# Patient Record
Sex: Female | Born: 1960
Health system: Southern US, Community
[De-identification: ages and names within clinical notes are randomized; demographics above are authoritative.]

## PROBLEM LIST (undated history)

## (undated) DIAGNOSIS — F419 Anxiety disorder, unspecified: Secondary | ICD-10-CM

## (undated) DIAGNOSIS — D649 Anemia, unspecified: Secondary | ICD-10-CM

## (undated) DIAGNOSIS — I10 Essential (primary) hypertension: Secondary | ICD-10-CM

## (undated) DIAGNOSIS — J45909 Unspecified asthma, uncomplicated: Secondary | ICD-10-CM

## (undated) HISTORY — PX: HYSTEROSCOPY: SHX211

## (undated) HISTORY — PX: OTHER SURGICAL HISTORY: SHX169

---

## 1997-10-13 ENCOUNTER — Emergency Department (HOSPITAL_COMMUNITY): Admission: EM | Admit: 1997-10-13 | Discharge: 1997-10-13 | Payer: Self-pay | Admitting: *Deleted

## 1998-07-16 ENCOUNTER — Encounter: Payer: Self-pay | Admitting: Emergency Medicine

## 1998-07-16 ENCOUNTER — Emergency Department (HOSPITAL_COMMUNITY): Admission: EM | Admit: 1998-07-16 | Discharge: 1998-07-16 | Payer: Self-pay | Admitting: Emergency Medicine

## 1999-08-15 ENCOUNTER — Ambulatory Visit (HOSPITAL_COMMUNITY): Admission: RE | Admit: 1999-08-15 | Discharge: 1999-08-15 | Payer: Self-pay | Admitting: Obstetrics and Gynecology

## 1999-08-15 ENCOUNTER — Encounter (INDEPENDENT_AMBULATORY_CARE_PROVIDER_SITE_OTHER): Payer: Self-pay

## 2005-07-15 ENCOUNTER — Emergency Department (HOSPITAL_COMMUNITY): Admission: EM | Admit: 2005-07-15 | Discharge: 2005-07-16 | Payer: Self-pay | Admitting: Emergency Medicine

## 2008-03-27 ENCOUNTER — Other Ambulatory Visit: Admission: RE | Admit: 2008-03-27 | Discharge: 2008-03-27 | Payer: Self-pay | Admitting: Family Medicine

## 2009-03-29 ENCOUNTER — Other Ambulatory Visit: Admission: RE | Admit: 2009-03-29 | Discharge: 2009-03-29 | Payer: Self-pay | Admitting: Family Medicine

## 2010-07-19 NOTE — Op Note (Signed)
[facility]  Patient:    Donna Peterson, Donna Peterson                      MRN: [id number] Proc. Date: 08/15/99 Adm. Date:  [id number] Disc. Date: [id number] Attending:  Lendon Colonel                           Operative Report  PREOPERATIVE DIAGNOSIS:       Diffuse periods and uterine fibroids.  POSTOPERATIVE DIAGNOSIS:      Diffuse periods and uterine fibroids.  OPERATION:                    Hysteroscopy with resection of fibroids.  SURGEON:                      Katherine Roan, M.D.  DESCRIPTION OF PROCEDURE:     The patient was placed in the lithotomy position, prepped and draped in the usual fashion.  The osmotic dilator was removed.  The hysteroscope was inserted and two large endometrial submucosal fibroids were resected using the resectoscope.  No unusual blood loss occurred.  Prior to the resection, in injected the uterus with approximately 20 cc of a Pitressin solution.  All of the resected material was sent to the lab for study. DD:  08/15/99 TD:  08/19/99 Job: [id number]

## 2010-11-02 ENCOUNTER — Emergency Department (HOSPITAL_COMMUNITY): Payer: 59

## 2010-11-02 ENCOUNTER — Emergency Department (HOSPITAL_COMMUNITY)
Admission: EM | Admit: 2010-11-02 | Discharge: 2010-11-02 | Disposition: A | Discharge status: Home / Self Care | Payer: 59 | Attending: Emergency Medicine | Admitting: Emergency Medicine

## 2010-11-02 DIAGNOSIS — R509 Fever, unspecified: Secondary | ICD-10-CM | POA: Insufficient documentation

## 2010-11-02 DIAGNOSIS — R6884 Jaw pain: Secondary | ICD-10-CM | POA: Insufficient documentation

## 2010-11-02 DIAGNOSIS — R0602 Shortness of breath: Secondary | ICD-10-CM | POA: Insufficient documentation

## 2010-11-02 DIAGNOSIS — R079 Chest pain, unspecified: Secondary | ICD-10-CM | POA: Insufficient documentation

## 2010-11-02 DIAGNOSIS — M79609 Pain in unspecified limb: Secondary | ICD-10-CM | POA: Insufficient documentation

## 2010-11-02 DIAGNOSIS — J984 Other disorders of lung: Secondary | ICD-10-CM | POA: Insufficient documentation

## 2010-11-02 LAB — POCT I-STAT TROPONIN I: Troponin i, poc: 0 ng/mL (ref 0.00–0.08)

## 2010-11-02 LAB — DIFFERENTIAL
Basophils Absolute: 0 10*3/uL (ref 0.0–0.1)
Basophils Relative: 0 % (ref 0–1)
Eosinophils Absolute: 0.1 10*3/uL (ref 0.0–0.7)
Eosinophils Relative: 2 % (ref 0–5)
Lymphocytes Relative: 36 % (ref 12–46)
Lymphs Abs: 2.5 10*3/uL (ref 0.7–4.0)
Monocytes Absolute: 0.5 10*3/uL (ref 0.1–1.0)
Monocytes Relative: 8 % (ref 3–12)
Neutro Abs: 3.8 10*3/uL (ref 1.7–7.7)
Neutrophils Relative %: 55 % (ref 43–77)

## 2010-11-02 LAB — CBC
HCT: 38.3 % (ref 36.0–46.0)
Hemoglobin: 12.7 g/dL (ref 12.0–15.0)
MCH: 24 pg — ABNORMAL LOW (ref 26.0–34.0)
MCHC: 33.2 g/dL (ref 30.0–36.0)
MCV: 72.3 fL — ABNORMAL LOW (ref 78.0–100.0)
Platelets: 273 10*3/uL (ref 150–400)
RBC: 5.3 MIL/uL — ABNORMAL HIGH (ref 3.87–5.11)
RDW: 14.4 % (ref 11.5–15.5)
WBC: 6.9 10*3/uL (ref 4.0–10.5)

## 2010-11-02 LAB — BASIC METABOLIC PANEL
BUN: 16 mg/dL (ref 6–23)
CO2: 24 mEq/L (ref 19–32)
Calcium: 9.1 mg/dL (ref 8.4–10.5)
Chloride: 105 mEq/L (ref 96–112)
Creatinine, Ser: 1.01 mg/dL (ref 0.50–1.10)
GFR calc Af Amer: >60 mL/min (ref >60)
GFR calc non Af Amer: 59 mL/min — ABNORMAL LOW (ref >60)
Glucose, Bld: 103 mg/dL — ABNORMAL HIGH (ref 70–99)
Potassium: 3.5 mEq/L (ref 3.5–5.1)
Sodium: 139 mEq/L (ref 135–145)

## 2010-11-02 MED ORDER — IOHEXOL 300 MG/ML  SOLN
90.0000 mL | Freq: Once | INTRAMUSCULAR | Status: AC | PRN
  Administered 2010-11-02: 90 mL via INTRAVENOUS

## 2012-01-23 ENCOUNTER — Emergency Department (HOSPITAL_COMMUNITY)
Admission: EM | Admit: 2012-01-23 | Discharge: 2012-01-23 | Disposition: A | Discharge status: Home / Self Care | Payer: 59 | Attending: Emergency Medicine | Admitting: Emergency Medicine

## 2012-01-23 ENCOUNTER — Emergency Department (HOSPITAL_COMMUNITY): Payer: 59

## 2012-01-23 ENCOUNTER — Encounter (HOSPITAL_COMMUNITY): Payer: Self-pay | Admitting: *Deleted

## 2012-01-23 DIAGNOSIS — J069 Acute upper respiratory infection, unspecified: Secondary | ICD-10-CM | POA: Insufficient documentation

## 2012-01-23 DIAGNOSIS — G4700 Insomnia, unspecified: Secondary | ICD-10-CM | POA: Insufficient documentation

## 2012-01-23 DIAGNOSIS — F419 Anxiety disorder, unspecified: Secondary | ICD-10-CM

## 2012-01-23 DIAGNOSIS — I10 Essential (primary) hypertension: Secondary | ICD-10-CM | POA: Insufficient documentation

## 2012-01-23 DIAGNOSIS — J45909 Unspecified asthma, uncomplicated: Secondary | ICD-10-CM | POA: Insufficient documentation

## 2012-01-23 DIAGNOSIS — F430 Acute stress reaction: Secondary | ICD-10-CM | POA: Insufficient documentation

## 2012-01-23 DIAGNOSIS — F411 Generalized anxiety disorder: Secondary | ICD-10-CM | POA: Insufficient documentation

## 2012-01-23 HISTORY — DX: Unspecified asthma, uncomplicated: J45.909

## 2012-01-23 HISTORY — DX: Essential (primary) hypertension: I10

## 2012-01-23 LAB — COMPREHENSIVE METABOLIC PANEL
ALT: 22 U/L (ref 0–35)
AST: 24 U/L (ref 0–37)
Albumin: 3.6 g/dL (ref 3.5–5.2)
CO2: 27 mEq/L (ref 19–32)
Calcium: 9.3 mg/dL (ref 8.4–10.5)
Chloride: 96 mEq/L (ref 96–112)
GFR calc non Af Amer: 62 mL/min — ABNORMAL LOW (ref >90)
Sodium: 133 mEq/L — ABNORMAL LOW (ref 135–145)
Total Bilirubin: 0.3 mg/dL (ref 0.3–1.2)

## 2012-01-23 LAB — CBC
Platelets: 324 10*3/uL (ref 150–400)
RBC: 5.29 MIL/uL — ABNORMAL HIGH (ref 3.87–5.11)
RDW: 13.9 % (ref 11.5–15.5)
WBC: 7.3 10*3/uL (ref 4.0–10.5)

## 2012-01-23 MED ORDER — LORAZEPAM 1 MG PO TABS
1.0000 mg | ORAL_TABLET | Freq: Once | ORAL | Status: AC
  Administered 2012-01-23: 1 mg via ORAL
  Filled 2012-01-23: qty 1

## 2012-01-23 MED ORDER — LORAZEPAM 1 MG PO TABS: 1.0000 mg | ORAL_TABLET | Freq: Three times a day (TID) | ORAL | Status: DC | PRN

## 2012-01-23 NOTE — ED Provider Notes (Signed)
History     CSN: [id number]  Arrival date & time 01/23/12  0226   First MD Initiated Contact with Patient 01/23/12 0235      Chief Complaint  Patient presents with  . Shortness of Breath    (Consider location/radiation/quality/duration/timing/severity/associated sxs/prior treatment) HPI Comments: Pt states has been under a lot of stress related to her mother and father dying around this time of year. + insomnia  No homicidal or suicidal ideation  Patient is a 51 y.o. female presenting with shortness of breath. The history is provided by the patient.  Shortness of Breath  The current episode started today. The problem occurs rarely. The problem has been gradually improving. The problem is mild. Nothing relieves the symptoms. Nothing aggravates the symptoms. Associated symptoms include shortness of breath.    Past Medical History  Diagnosis Date  . Hypertension   . Asthma     not severe    Past Surgical History  Procedure Date  . Cesarian   . Robotic assisted laparoscopic vaginal hysterectomy with fibroid removal     No family history on file.  History  Substance Use Topics  . Smoking status: Never Smoker   . Smokeless tobacco: Not on file  . Alcohol Use: No    OB History    Grav Para Term Preterm Abortions TAB SAB Ect Mult Living                  Review of Systems  Respiratory: Positive for shortness of breath.   All other systems reviewed and are negative.    Allergies  Review of patient's allergies indicates no known allergies.  Home Medications  No current outpatient prescriptions on file.  BP 178/104  Pulse 126  Temp 99.2 F (37.3 C) (Oral)  Resp 18  Ht 5\' 4"  (1.626 m)  Wt 235 lb (106.595 kg)  BMI 40.34 kg/m2  SpO2 98%  Physical Exam  Constitutional: She is oriented to person, place, and time. She appears well-developed and well-nourished.  HENT:  Head: Normocephalic and atraumatic.  Eyes: Conjunctivae normal and EOM are normal. Pupils  are equal, round, and reactive to light.  Neck: Normal range of motion.  Cardiovascular: Normal rate, regular rhythm and normal heart sounds.   Pulmonary/Chest: Effort normal and breath sounds normal.  Abdominal: Soft. Bowel sounds are normal.  Musculoskeletal: Normal range of motion.  Neurological: She is alert and oriented to person, place, and time.  Skin: Skin is warm and dry.  Psychiatric: She has a normal mood and affect. Her behavior is normal.    ED Course  Procedures (including critical care time)   Labs Reviewed  CBC  COMPREHENSIVE METABOLIC PANEL   No results found.   No diagnosis found.    Date: 01/23/2012  Rate: 99  Rhythm: normal sinus rhythm  QRS Axis: normal  Intervals: normal  ST/T Wave abnormalities: normal  Conduction Disutrbances: none  Narrative Interpretation: unremarkable     MDM  Will labs,  Xray,  Ativan,  Reasses.  Mll anxiety.     Improved.  Will dc to outpt fu,  Ret new/worsening sxs     Fae Blossom Lytle Michaels, MD 01/23/12 0408

## 2012-01-23 NOTE — ED Notes (Addendum)
Pt to ED c/o acute onset sob x 1 hour.  Pt states she has been unable to sleep d/t high stress in her life at present.  Denies cardiac hx, but sates "touch" of asthma.  Pt denies swelling, but states increased urination with her bp med.

## 2012-03-12 ENCOUNTER — Other Ambulatory Visit (HOSPITAL_COMMUNITY): Payer: Self-pay | Admitting: Family Medicine

## 2012-03-12 DIAGNOSIS — Z1231 Encounter for screening mammogram for malignant neoplasm of breast: Secondary | ICD-10-CM

## 2012-03-23 ENCOUNTER — Ambulatory Visit (HOSPITAL_COMMUNITY)
Admission: RE | Admit: 2012-03-23 | Discharge: 2012-03-23 | Disposition: A | Discharge status: Home / Self Care | Payer: 59 | Source: Ambulatory Visit | Attending: Family Medicine | Admitting: Family Medicine

## 2012-03-23 DIAGNOSIS — Z1231 Encounter for screening mammogram for malignant neoplasm of breast: Secondary | ICD-10-CM | POA: Insufficient documentation

## 2012-06-16 ENCOUNTER — Other Ambulatory Visit (HOSPITAL_COMMUNITY)
Admission: RE | Admit: 2012-06-16 | Discharge: 2012-06-16 | Disposition: A | Discharge status: Home / Self Care | Payer: 59 | Source: Ambulatory Visit | Attending: Family Medicine | Admitting: Family Medicine

## 2012-06-16 ENCOUNTER — Other Ambulatory Visit: Payer: Self-pay | Admitting: Family Medicine

## 2012-06-16 DIAGNOSIS — Z0000 Encounter for general adult medical examination without abnormal findings: Secondary | ICD-10-CM | POA: Insufficient documentation

## 2012-06-21 ENCOUNTER — Other Ambulatory Visit: Payer: Self-pay | Admitting: Family Medicine

## 2012-06-21 DIAGNOSIS — R911 Solitary pulmonary nodule: Secondary | ICD-10-CM

## 2012-06-30 ENCOUNTER — Inpatient Hospital Stay: Admission: RE | Admit: 2012-06-30 | Payer: 59 | Source: Ambulatory Visit

## 2013-06-10 ENCOUNTER — Other Ambulatory Visit: Payer: Self-pay | Admitting: Gastroenterology

## 2013-10-05 ENCOUNTER — Encounter (HOSPITAL_COMMUNITY): Payer: Self-pay | Admitting: Pharmacy Technician

## 2013-10-12 ENCOUNTER — Other Ambulatory Visit: Payer: Self-pay | Admitting: Gastroenterology

## 2013-10-12 ENCOUNTER — Encounter (HOSPITAL_COMMUNITY): Payer: Self-pay | Admitting: *Deleted

## 2013-10-12 NOTE — Addendum Note (Signed)
Addended byClarene Essex on: 10/12/2013 11:16 AM   Modules accepted: Orders

## 2013-10-21 ENCOUNTER — Encounter (HOSPITAL_COMMUNITY)
Admission: RE | Disposition: A | Discharge status: Home / Self Care | Payer: Self-pay | Source: Ambulatory Visit | Attending: Gastroenterology

## 2013-10-21 ENCOUNTER — Encounter (HOSPITAL_COMMUNITY): Payer: 59 | Admitting: Anesthesiology

## 2013-10-21 ENCOUNTER — Ambulatory Visit (HOSPITAL_COMMUNITY)
Admission: RE | Admit: 2013-10-21 | Discharge: 2013-10-21 | Disposition: A | Discharge status: Home / Self Care | Payer: 59 | Source: Ambulatory Visit | Attending: Gastroenterology | Admitting: Gastroenterology

## 2013-10-21 ENCOUNTER — Encounter (HOSPITAL_COMMUNITY): Payer: Self-pay | Admitting: *Deleted

## 2013-10-21 ENCOUNTER — Ambulatory Visit (HOSPITAL_COMMUNITY): Payer: 59 | Admitting: Anesthesiology

## 2013-10-21 DIAGNOSIS — Z1211 Encounter for screening for malignant neoplasm of colon: Secondary | ICD-10-CM | POA: Diagnosis not present

## 2013-10-21 DIAGNOSIS — Z860100 Personal history of colon polyps, unspecified: Secondary | ICD-10-CM | POA: Insufficient documentation

## 2013-10-21 DIAGNOSIS — D126 Benign neoplasm of colon, unspecified: Secondary | ICD-10-CM | POA: Diagnosis not present

## 2013-10-21 DIAGNOSIS — I10 Essential (primary) hypertension: Secondary | ICD-10-CM | POA: Insufficient documentation

## 2013-10-21 DIAGNOSIS — J45909 Unspecified asthma, uncomplicated: Secondary | ICD-10-CM | POA: Insufficient documentation

## 2013-10-21 HISTORY — DX: Anemia, unspecified: D64.9

## 2013-10-21 HISTORY — PX: COLONOSCOPY WITH PROPOFOL: SHX5780

## 2013-10-21 HISTORY — DX: Anxiety disorder, unspecified: F41.9

## 2013-10-21 SURGERY — COLONOSCOPY WITH PROPOFOL
Anesthesia: Monitor Anesthesia Care

## 2013-10-21 MED ORDER — PROPOFOL 10 MG/ML IV BOLUS
INTRAVENOUS | Status: AC
  Filled 2013-10-21: qty 20

## 2013-10-21 MED ORDER — PROPOFOL 10 MG/ML IV BOLUS
INTRAVENOUS | Status: DC | PRN
  Administered 2013-10-21 (×2): 25 mg via INTRAVENOUS
  Administered 2013-10-21 (×3): 50 mg via INTRAVENOUS
  Administered 2013-10-21 (×2): 25 mg via INTRAVENOUS
  Administered 2013-10-21 (×2): 50 mg via INTRAVENOUS

## 2013-10-21 MED ORDER — LACTATED RINGERS IV SOLN
INTRAVENOUS | Status: DC
  Administered 2013-10-21: 1000 mL via INTRAVENOUS

## 2013-10-21 MED ORDER — SODIUM CHLORIDE 0.9 % IV SOLN: INTRAVENOUS | Status: DC

## 2013-10-21 MED ORDER — LACTATED RINGERS IV SOLN
INTRAVENOUS | Status: DC | PRN
  Administered 2013-10-21 (×2): via INTRAVENOUS

## 2013-10-21 SURGICAL SUPPLY — 22 items

## 2013-10-21 NOTE — Discharge Instructions (Addendum)
Monitored Anesthesia Care Monitored anesthesia care is an anesthesia service for a medical procedure. Anesthesia is the loss of the ability to feel pain. It is produced by medicines called anesthetics. It may affect a small area of your body (local anesthesia), a large area of your body (regional anesthesia), or your entire body (general anesthesia). The need for monitored anesthesia care depends your procedure, your condition, and the potential need for regional or general anesthesia. It is often provided during procedures where:   General anesthesia may be needed if there are complications. This is because you need special care when you are under general anesthesia.   You will be under local or regional anesthesia. This is so that you are able to have higher levels of anesthesia if needed.   You will receive calming medicines (sedatives). This is especially the case if sedatives are given to put you in a semi-conscious state of relaxation (deep sedation). This is because the amount of sedative needed to produce this state can be hard to predict. Too much of a sedative can produce general anesthesia. Monitored anesthesia care is performed by one or more health care providers who have special training in all types of anesthesia. You will need to meet with these health care providers before your procedure. During this meeting, they will ask you about your medical history. They will also give you instructions to follow. (For example, you will need to stop eating and drinking before your procedure. You may also need to stop or change medicines you are taking.) During your procedure, your health care providers will stay with you. They will:   Watch your condition. This includes watching your blood pressure, breathing, and level of pain.   Diagnose and treat problems that occur.   Give medicines if they are needed. These may include calming medicines (sedatives) and anesthetics.   Make sure you are  comfortable.  Having monitored anesthesia care does not necessarily mean that you will be under anesthesia. It does mean that your health care providers will be able to manage anesthesia if you need it or if it occurs. It also means that you will be able to have a different type of anesthesia than you are having if you need it. When your procedure is complete, your health care providers will continue to watch your condition. They will make sure any medicines wear off before you are allowed to go home.  Document Released: 11/13/2004 Document Revised: 07/04/2013 Document Reviewed: 03/31/2012 [facility] Patient Information 2015 [place], [place]. This information is not intended to replace advice given to you by your health care provider. Make sure you discuss any questions you have with your health care provider. Call if question or problem and for biopsy report in one week and followup as needed otherwise probably repeat colonoscopy in one year

## 2013-10-21 NOTE — Op Note (Signed)
[facility] [place], [address]   COLONOSCOPY PROCEDURE REPORT  PATIENT: Donna, Peterson  MR#: [id number] BIRTHDATE: 10/19/1960 , 52  yrs. old GENDER: Female ENDOSCOPIST: Clarene Essex, MD REFERRED HE:NIDPOEU Harris, M.D. PROCEDURE DATE:  10/21/2013 PROCEDURE:   Colonoscopy with snare polypectomy ASA CLASS:   Class II INDICATIONS:Patient's personal history of adenomatous colon polyps difficult to remove MEDICATIONS: propofol (Diprivan) 350mg  IV  DESCRIPTION OF PROCEDURE:   After the risks benefits and alternatives of the procedure were thoroughly explained, informed consent was obtained.  The Pentax Ped Colon [id number]  endoscope was introduced through the anus and advanced to the cecum, which was identified by both the appendix and ileocecal valve , limited by No adverse events experienced. to advance  to the cecum did require abdominal pressure but no position changes.  The quality of the prep was adequate. .  The instrument was then slowly withdrawn as the colon was fully examined.both polyps that were incompletely removed on initial colonoscopy were found in the mid descending a medium size semisolid soft polyp was seen with one small [place] ink tattoo and using the piecemeal technique was removed and pathology placed in the first container we then slowly withdrew to the larger splenic flexure polyp and a tattoo was seen as well and again using the piecemeal technique the polyp was seemingly removed and pathology placed in a second container and on slow withdrawal no additional polyps were seen and  anal rectal pull-through and retroflexion was normal and the scope was inserted sure which of the left side of the colon air and water were suctioned the scope was removed the patient tolerated the procedure well there was no obvious immediate complication         FINDINGS:  1. Large splenic flexure previously partially removed polyp with a tattoo  status post removal in piecemeal fashion with hot snare 2.medium sized mid ascending polyp removed as above3 otherwise within normal limits to the cecum  COMPLICATIONS: none  IMPRESSION:  above  RECOMMENDATIONS: await pathology probably recheck in one year just to make sure complete removal of both polyps and if so we can then extend her interval and happy see back sooner when necessary   _______________________________ eSigned:  Clarene Essex, MD 10/21/2013 9:10 AM   MP:NTIRWER Kenton Kingfisher, MD  PATIENT NAME:  Donna, Peterson MR#: [id number]

## 2013-10-21 NOTE — Progress Notes (Signed)
Donna Peterson 8:07 AM  Subjective: Patient with no complaints and specifically no GI complaints since we last saw her in the office and doing well medically Objective: Vital signs stable afebrile no acute distress exam please see pre-assessment evaluation  Assessment: Difficult to remove colon polyps  Plan: Okay to proceed with colonoscopy to try to remove polyps with anesthesia assistance  [facility] E

## 2013-10-21 NOTE — Anesthesia Preprocedure Evaluation (Addendum)
Anesthesia Evaluation  Patient identified by MRN, date of birth, ID band Patient awake    Reviewed: Allergy & Precautions, H&P , NPO status , Patient's Chart, lab work & pertinent test results  Airway Mallampati: II TM Distance: >3 FB Neck ROM: Full    Dental no notable dental hx.    Pulmonary neg pulmonary ROS, asthma ,  breath sounds clear to auscultation  Pulmonary exam normal       Cardiovascular hypertension, Pt. on medications Rhythm:Regular Rate:Normal     Neuro/Psych negative neurological ROS  negative psych ROS   GI/Hepatic negative GI ROS, Neg liver ROS,   Endo/Other  Morbid obesity  Renal/GU negative Renal ROS  negative genitourinary   Musculoskeletal negative musculoskeletal ROS (+)   Abdominal   Peds negative pediatric ROS (+)  Hematology negative hematology ROS (+)   Anesthesia Other Findings   Reproductive/Obstetrics negative OB ROS                          Anesthesia Physical Anesthesia Plan  ASA: III  Anesthesia Plan: MAC   Post-op Pain Management:    Induction:   Airway Management Planned: Simple Face Mask  Additional Equipment:   Intra-op Plan:   Post-operative Plan:   Informed Consent: I have reviewed the patients History and Physical, chart, labs and discussed the procedure including the risks, benefits and alternatives for the proposed anesthesia with the patient or authorized representative who has indicated his/her understanding and acceptance.   Dental advisory given  Plan Discussed with:   Anesthesia Plan Comments:         Anesthesia Quick Evaluation

## 2013-10-21 NOTE — Anesthesia Postprocedure Evaluation (Signed)
  Anesthesia Post-op Note  Patient: Donna Peterson  Procedure(s) Performed: Procedure(s) (LRB): COLONOSCOPY WITH PROPOFOL (N/A)  Patient Location: PACU  Anesthesia Type: MAC  Level of Consciousness: awake and alert   Airway and Oxygen Therapy: Patient Spontanous Breathing  Post-op Pain: mild  Post-op Assessment: Post-op Vital signs reviewed, Patient's Cardiovascular Status Stable, Respiratory Function Stable, Patent Airway and No signs of Nausea or vomiting  Last Vitals:  Filed Vitals:   10/21/13 0920  BP: 162/98  Pulse: 71  Temp:   Resp: 17    Post-op Vital Signs: stable   Complications: No apparent anesthesia complications

## 2013-10-21 NOTE — Transfer of Care (Signed)
Immediate Anesthesia Transfer of Care Note  Patient: Donna Peterson  Procedure(s) Performed: Procedure(s): COLONOSCOPY WITH PROPOFOL (N/A) HOT HEMOSTASIS (ARGON PLASMA COAGULATION/BICAP) (N/A)  Patient Location: PACU  Anesthesia Type:MAC  Level of Consciousness: awake, sedated and patient cooperative  Airway & Oxygen Therapy: Patient Spontanous Breathing and Patient connected to face mask oxygen  Post-op Assessment: Report given to PACU RN and Post -op Vital signs reviewed and stable  Post vital signs: Reviewed and stable  Complications: No apparent anesthesia complications

## 2013-10-24 ENCOUNTER — Encounter (HOSPITAL_COMMUNITY): Payer: Self-pay | Admitting: Gastroenterology

## 2014-10-02 ENCOUNTER — Other Ambulatory Visit: Payer: Self-pay | Admitting: Family Medicine

## 2015-04-25 ENCOUNTER — Encounter (HOSPITAL_COMMUNITY): Payer: Self-pay | Admitting: *Deleted

## 2015-05-04 ENCOUNTER — Ambulatory Visit (HOSPITAL_COMMUNITY): Admission: RE | Admit: 2015-05-04 | Payer: 59 | Source: Ambulatory Visit | Admitting: Gastroenterology

## 2015-05-04 SURGERY — COLONOSCOPY WITH PROPOFOL
Anesthesia: Monitor Anesthesia Care

## 2015-09-14 ENCOUNTER — Other Ambulatory Visit (HOSPITAL_COMMUNITY)
Admission: RE | Admit: 2015-09-14 | Discharge: 2015-09-14 | Disposition: A | Discharge status: Home / Self Care | Payer: 59 | Source: Ambulatory Visit | Attending: Family Medicine | Admitting: Family Medicine

## 2015-09-14 ENCOUNTER — Other Ambulatory Visit: Payer: Self-pay | Admitting: Family Medicine

## 2015-09-14 DIAGNOSIS — Z01419 Encounter for gynecological examination (general) (routine) without abnormal findings: Secondary | ICD-10-CM | POA: Insufficient documentation

## 2015-09-17 LAB — CYTOLOGY - PAP

## 2016-02-12 ENCOUNTER — Other Ambulatory Visit: Payer: Self-pay | Admitting: Gastroenterology

## 2016-02-14 ENCOUNTER — Encounter (HOSPITAL_COMMUNITY): Payer: Self-pay | Admitting: Anesthesiology

## 2016-02-14 NOTE — Anesthesia Preprocedure Evaluation (Addendum)
Anesthesia Evaluation  Patient identified by MRN, date of birth, ID band Patient awake    Reviewed: Allergy & Precautions, NPO status , Patient's Chart, lab work & pertinent test results, reviewed documented beta blocker date and time   Airway Mallampati: III       Dental no notable dental hx. (+) Teeth Intact, Caps, Dental Advisory Given   Pulmonary asthma ,    Pulmonary exam normal breath sounds clear to auscultation       Cardiovascular hypertension, Pt. on medications and Pt. on home beta blockers Normal cardiovascular exam Rhythm:Regular Rate:Normal     Neuro/Psych Anxiety negative neurological ROS     GI/Hepatic Neg liver ROS, Hx/o colon polyps   Endo/Other  Obesity  Renal/GU negative Renal ROS  negative genitourinary   Musculoskeletal negative musculoskeletal ROS (+)   Abdominal (+) + obese,   Peds  Hematology  (+) anemia ,   Anesthesia Other Findings   Reproductive/Obstetrics                           Lab Results  Component Value Date   WBC 7.3 01/23/2012   HGB 12.9 01/23/2012   HCT 37.9 01/23/2012   MCV 71.6 (L) 01/23/2012   PLT 324 01/23/2012    Anesthesia Physical Anesthesia Plan  ASA: II  Anesthesia Plan: MAC   Post-op Pain Management:    Induction: Intravenous  Airway Management Planned: Natural Airway, Nasal Cannula and Simple Face Mask  Additional Equipment:   Intra-op Plan:   Post-operative Plan:   Informed Consent: I have reviewed the patients History and Physical, chart, labs and discussed the procedure including the risks, benefits and alternatives for the proposed anesthesia with the patient or authorized representative who has indicated his/her understanding and acceptance.   Dental advisory given  Plan Discussed with:   Anesthesia Plan Comments:        Anesthesia Quick Evaluation

## 2016-02-15 ENCOUNTER — Ambulatory Visit (HOSPITAL_COMMUNITY): Payer: 59 | Admitting: Anesthesiology

## 2016-02-15 ENCOUNTER — Encounter (HOSPITAL_COMMUNITY): Payer: Self-pay

## 2016-02-15 ENCOUNTER — Encounter (HOSPITAL_COMMUNITY)
Admission: RE | Disposition: A | Discharge status: Home / Self Care | Payer: Self-pay | Source: Ambulatory Visit | Attending: Gastroenterology

## 2016-02-15 ENCOUNTER — Ambulatory Visit (HOSPITAL_COMMUNITY)
Admission: RE | Admit: 2016-02-15 | Discharge: 2016-02-15 | Disposition: A | Discharge status: Home / Self Care | Payer: 59 | Source: Ambulatory Visit | Attending: Gastroenterology | Admitting: Gastroenterology

## 2016-02-15 DIAGNOSIS — D123 Benign neoplasm of transverse colon: Secondary | ICD-10-CM | POA: Diagnosis not present

## 2016-02-15 DIAGNOSIS — Z1211 Encounter for screening for malignant neoplasm of colon: Secondary | ICD-10-CM | POA: Insufficient documentation

## 2016-02-15 DIAGNOSIS — D122 Benign neoplasm of ascending colon: Secondary | ICD-10-CM | POA: Insufficient documentation

## 2016-02-15 DIAGNOSIS — Z86010 Personal history of colonic polyps: Secondary | ICD-10-CM | POA: Insufficient documentation

## 2016-02-15 DIAGNOSIS — K621 Rectal polyp: Secondary | ICD-10-CM | POA: Insufficient documentation

## 2016-02-15 HISTORY — PX: COLONOSCOPY WITH PROPOFOL: SHX5780

## 2016-02-15 SURGERY — COLONOSCOPY WITH PROPOFOL
Anesthesia: Monitor Anesthesia Care

## 2016-02-15 MED ORDER — MIDAZOLAM HCL 2 MG/2ML IJ SOLN
INTRAMUSCULAR | Status: AC
  Filled 2016-02-15: qty 2

## 2016-02-15 MED ORDER — MIDAZOLAM HCL 2 MG/2ML IJ SOLN
INTRAMUSCULAR | Status: DC | PRN
  Administered 2016-02-15: 2 mg via INTRAVENOUS

## 2016-02-15 MED ORDER — PROPOFOL 10 MG/ML IV BOLUS
INTRAVENOUS | Status: AC
  Filled 2016-02-15: qty 40

## 2016-02-15 MED ORDER — PROPOFOL 10 MG/ML IV BOLUS
INTRAVENOUS | Status: AC
  Filled 2016-02-15: qty 20

## 2016-02-15 MED ORDER — LIDOCAINE 2% (20 MG/ML) 5 ML SYRINGE
INTRAMUSCULAR | Status: DC | PRN
  Administered 2016-02-15: 40 mg via INTRAVENOUS

## 2016-02-15 MED ORDER — LIDOCAINE 2% (20 MG/ML) 5 ML SYRINGE
INTRAMUSCULAR | Status: AC
  Filled 2016-02-15: qty 5

## 2016-02-15 MED ORDER — PROPOFOL 10 MG/ML IV BOLUS
INTRAVENOUS | Status: DC | PRN
  Administered 2016-02-15 (×6): 20 mg via INTRAVENOUS
  Administered 2016-02-15 (×2): 40 mg via INTRAVENOUS
  Administered 2016-02-15 (×3): 20 mg via INTRAVENOUS
  Administered 2016-02-15: 40 mg via INTRAVENOUS
  Administered 2016-02-15 (×2): 20 mg via INTRAVENOUS
  Administered 2016-02-15: 10 mg via INTRAVENOUS
  Administered 2016-02-15: 20 mg via INTRAVENOUS

## 2016-02-15 MED ORDER — ONDANSETRON HCL 4 MG/2ML IJ SOLN
INTRAMUSCULAR | Status: AC
  Filled 2016-02-15: qty 2

## 2016-02-15 MED ORDER — SODIUM CHLORIDE 0.9 % IV SOLN: INTRAVENOUS | Status: DC

## 2016-02-15 MED ORDER — LACTATED RINGERS IV SOLN
INTRAVENOUS | Status: DC
  Administered 2016-02-15 (×2): via INTRAVENOUS

## 2016-02-15 MED ORDER — ONDANSETRON HCL 4 MG/2ML IJ SOLN
INTRAMUSCULAR | Status: DC | PRN
  Administered 2016-02-15: 4 mg via INTRAVENOUS

## 2016-02-15 SURGICAL SUPPLY — 22 items

## 2016-02-15 NOTE — Op Note (Signed)
[facility] Patient Name: Donna Peterson Procedure Date: 02/15/2016 MRN: [id number] Attending MD: Clarene Essex , MD Date of Birth: June 04, 1960 CSN: [id number] Age: 55 Admit Type: Outpatient Procedure:                Colonoscopy Indications:              High risk colon cancer surveillance: Personal                            history of large difficult to remove colonic                            polyps, Last colonoscopy: August 2015 Providers:                Clarene Essex, MD, Cleda Daub, RN, Alfonso Patten,                            Technician, Glenis Smoker, CRNA Referring MD:              Medicines:                Midazolam 2 mg IV, Propofol total dose 370 mg IV,40                            mg IV lidocaine Complications:            No immediate complications. Estimated Blood Loss:     Estimated blood loss: none. Procedure:                Pre-Anesthesia Assessment:                           - Prior to the procedure, a History and Physical                            was performed, and patient medications and                            allergies were reviewed. The patient's tolerance of                            previous anesthesia was also reviewed. The risks                            and benefits of the procedure and the sedation                            options and risks were discussed with the patient.                            All questions were answered, and informed consent                            was obtained. Prior Anticoagulants: The patient has  taken aspirin, last dose was 7 days prior to                            procedure. ASA Grade Assessment: II - A patient                            with mild systemic disease. After reviewing the                            risks and benefits, the patient was deemed in                            satisfactory condition to undergo the procedure.                           After obtaining  informed consent, the colonoscope                            was passed under direct vision. Throughout the                            procedure, the patient's blood pressure, pulse, and                            oxygen saturations were monitored continuously. The                            [id number]) scope was introduced through                            the anus and advanced to the the cecum, identified                            by appendiceal orifice and ileocecal valve. The                            ileocecal valve, appendiceal orifice, and rectum                            were photographed. The colonoscopy was performed                            without difficulty. The patient tolerated the                            procedure well. The quality of the bowel                            preparation was adequate. Scope In: 7:43:32 AM Scope Out: 8:15:25 AM Scope Withdrawal Time: 0 hours 27 minutes 0 seconds  Total Procedure Duration: 0 hours 31 minutes 53 seconds  Findings:      A diminutive polyp was found in the rectum. The polyp was semi-sessile.       Biopsies were  taken with a cold forceps for histology.      A small polyp was found in the splenic flexure. The polyp was       semi-sessile. the [place] ink was present The polyp was removed with a       hot snare. Resection and retrieval were complete.      A probable scar from previous polypectomy versus a small polyp was found       in the mid ascending colon. The ?polyp was semi-sessile. Biopsies were       taken with a cold forceps for histology. we did not appreciate the tiny       amount of [place] ink supposedly tattooed in the ascending in the past      The exam was otherwise without abnormality. Impression:               - One diminutive polyp in the rectum. Biopsied.                           - One small polyp at the splenic flexure, removed                            with a hot snare. Resected and retrieved.                            - One scar versus questionable small polyp in the                            mid ascending colon. Biopsied.                           - The examination was otherwise normal. Moderate Sedation:      N/A- Per Anesthesia Care Recommendation:           - Patient has a contact number available for                            emergencies. The signs and symptoms of potential                            delayed complications were discussed with the                            patient. Return to normal activities tomorrow.                            Written discharge instructions were provided to the                            patient.                           - Soft diet today.                           - Continue present medications.                           -  Await pathology results.                           - Repeat colonoscopy in 3 years for surveillance                            based on pathology results.                           - Return to GI office PRN.                           - Telephone GI clinic for pathology results in 1                            week.                           - Telephone GI clinic if symptomatic PRN. Procedure Code(s):        --- Professional ---                           [phone], Colonoscopy, flexible; with removal of                            tumor(s), polyp(s), or other lesion(s) by snare                            technique                           45380, 61, Colonoscopy, flexible; with biopsy,                            single or multiple Diagnosis Code(s):        --- Professional ---                           Z86.010, Personal history of colonic polyps                           K62.1, Rectal polyp                           D12.3, Benign neoplasm of transverse colon (hepatic                            flexure or splenic flexure)                           D12.2, Benign neoplasm of ascending colon CPT copyright 2016 American  Medical Association. All rights reserved. The codes documented in this report are preliminary and upon coder review may  be revised to meet current compliance requirements. Clarene Essex, MD 02/15/2016 8:27:04 AM This report has been signed electronically. Number of Addenda: 0

## 2016-02-15 NOTE — Transfer of Care (Signed)
Immediate Anesthesia Transfer of Care Note  Patient: Donna Peterson  Procedure(s) Performed: Procedure(s): COLONOSCOPY WITH PROPOFOL (N/A) HOT HEMOSTASIS (ARGON PLASMA COAGULATION/BICAP) (N/A)  Patient Location: PACU Endo  Anesthesia Type:MAC  Level of Consciousness: awake and alert   Airway & Oxygen Therapy: Patient Spontanous Breathing and Patient connected to face mask oxygen  Post-op Assessment: Report given to RN and Post -op Vital signs reviewed and stable  Post vital signs: Reviewed and stable  Last Vitals:  Vitals:   02/15/16 0636  BP: 132/83  Pulse: (!) 102  Resp: 19  Temp: 36.9 C    Last Pain:  Vitals:   02/15/16 0636  TempSrc: Oral         Complications: No apparent anesthesia complications

## 2016-02-15 NOTE — Progress Notes (Signed)
Donna Peterson 7:29 AM  Subjective: Patient without any GI complaints and no new medical problems since I last saw her in the office  Objective: Vital signs stable afebrile exam please see preassessment evaluation July labs reviewed  Assessment: Difficult to remove large colon polyps  Plan: Okay to proceed with colonoscopy with anesthesia assistance  [facility] E  Pager [phone] After 5PM or if no answer call [phone]

## 2016-02-15 NOTE — Discharge Instructions (Signed)
Call if question or problem otherwise call for biopsy report in 1 week and probably repeat colonoscopy in our office in 3 years but follow-up sooner if any GI problems arise

## 2016-02-15 NOTE — Anesthesia Postprocedure Evaluation (Signed)
Anesthesia Post Note  Patient: Donna Peterson  Procedure(s) Performed: Procedure(s) (LRB): COLONOSCOPY WITH PROPOFOL (N/A) HOT HEMOSTASIS (ARGON PLASMA COAGULATION/BICAP) (N/A)  Patient location during evaluation: PACU Anesthesia Type: MAC Level of consciousness: awake and alert and oriented Pain management: pain level controlled Vital Signs Assessment: post-procedure vital signs reviewed and stable Respiratory status: spontaneous breathing, nonlabored ventilation and respiratory function stable Cardiovascular status: stable and blood pressure returned to baseline Postop Assessment: no signs of nausea or vomiting Anesthetic complications: no    Last Vitals:  Vitals:   02/15/16 0636 02/15/16 0825  BP: 132/83 (!) 135/101  Pulse: (!) 102 77  Resp: 19 (!) 29  Temp: 36.9 C 36.6 C    Last Pain:  Vitals:   02/15/16 0825  TempSrc: Oral                 Donna Coxe A.

## 2016-02-18 ENCOUNTER — Encounter (HOSPITAL_COMMUNITY): Payer: Self-pay | Admitting: Gastroenterology

## 2016-02-20 ENCOUNTER — Other Ambulatory Visit: Payer: Self-pay | Admitting: Family Medicine

## 2016-02-20 DIAGNOSIS — Z1231 Encounter for screening mammogram for malignant neoplasm of breast: Secondary | ICD-10-CM

## 2016-04-10 ENCOUNTER — Ambulatory Visit
Admission: RE | Admit: 2016-04-10 | Discharge: 2016-04-10 | Disposition: A | Discharge status: Home / Self Care | Payer: 59 | Source: Ambulatory Visit | Attending: Family Medicine | Admitting: Family Medicine

## 2016-04-10 DIAGNOSIS — Z1231 Encounter for screening mammogram for malignant neoplasm of breast: Secondary | ICD-10-CM

## 2016-04-11 ENCOUNTER — Other Ambulatory Visit: Payer: Self-pay | Admitting: Family Medicine

## 2016-04-11 DIAGNOSIS — R928 Other abnormal and inconclusive findings on diagnostic imaging of breast: Secondary | ICD-10-CM

## 2016-04-14 ENCOUNTER — Other Ambulatory Visit: Payer: Self-pay | Admitting: Family Medicine

## 2016-04-16 ENCOUNTER — Ambulatory Visit
Admission: RE | Admit: 2016-04-16 | Discharge: 2016-04-16 | Disposition: A | Discharge status: Home / Self Care | Payer: 59 | Source: Ambulatory Visit | Attending: Family Medicine | Admitting: Family Medicine

## 2016-04-16 DIAGNOSIS — R928 Other abnormal and inconclusive findings on diagnostic imaging of breast: Secondary | ICD-10-CM

## 2016-04-16 DIAGNOSIS — N6310 Unspecified lump in the right breast, unspecified quadrant: Secondary | ICD-10-CM | POA: Diagnosis not present

## 2016-04-16 DIAGNOSIS — N6011 Diffuse cystic mastopathy of right breast: Secondary | ICD-10-CM | POA: Diagnosis not present

## 2016-04-29 DIAGNOSIS — F5101 Primary insomnia: Secondary | ICD-10-CM | POA: Diagnosis not present

## 2016-04-29 DIAGNOSIS — R002 Palpitations: Secondary | ICD-10-CM | POA: Diagnosis not present

## 2016-04-29 DIAGNOSIS — L719 Rosacea, unspecified: Secondary | ICD-10-CM | POA: Diagnosis not present

## 2016-09-30 DIAGNOSIS — I10 Essential (primary) hypertension: Secondary | ICD-10-CM | POA: Diagnosis not present

## 2016-09-30 DIAGNOSIS — Z0000 Encounter for general adult medical examination without abnormal findings: Secondary | ICD-10-CM | POA: Diagnosis not present

## 2016-12-22 ENCOUNTER — Other Ambulatory Visit: Payer: Self-pay | Admitting: Nurse Practitioner

## 2016-12-22 ENCOUNTER — Ambulatory Visit
Admission: RE | Admit: 2016-12-22 | Discharge: 2016-12-22 | Disposition: A | Discharge status: Home / Self Care | Payer: Worker's Compensation | Source: Ambulatory Visit | Attending: Nurse Practitioner | Admitting: Nurse Practitioner

## 2016-12-22 DIAGNOSIS — R52 Pain, unspecified: Secondary | ICD-10-CM

## 2016-12-22 DIAGNOSIS — R609 Edema, unspecified: Secondary | ICD-10-CM

## 2017-03-31 DIAGNOSIS — F5101 Primary insomnia: Secondary | ICD-10-CM | POA: Diagnosis not present

## 2017-03-31 DIAGNOSIS — I10 Essential (primary) hypertension: Secondary | ICD-10-CM | POA: Diagnosis not present

## 2017-03-31 DIAGNOSIS — J0110 Acute frontal sinusitis, unspecified: Secondary | ICD-10-CM | POA: Diagnosis not present

## 2017-05-21 ENCOUNTER — Other Ambulatory Visit: Payer: Self-pay | Admitting: Family Medicine

## 2017-05-21 DIAGNOSIS — N6310 Unspecified lump in the right breast, unspecified quadrant: Secondary | ICD-10-CM

## 2017-10-20 DIAGNOSIS — I10 Essential (primary) hypertension: Secondary | ICD-10-CM | POA: Diagnosis not present

## 2017-10-20 DIAGNOSIS — J0110 Acute frontal sinusitis, unspecified: Secondary | ICD-10-CM | POA: Diagnosis not present

## 2017-10-20 DIAGNOSIS — Z0000 Encounter for general adult medical examination without abnormal findings: Secondary | ICD-10-CM | POA: Diagnosis not present

## 2017-10-20 DIAGNOSIS — F5101 Primary insomnia: Secondary | ICD-10-CM | POA: Diagnosis not present

## 2018-04-05 ENCOUNTER — Ambulatory Visit
Admission: RE | Admit: 2018-04-05 | Discharge: 2018-04-05 | Disposition: A | Discharge status: Home / Self Care | Payer: 59 | Source: Ambulatory Visit | Attending: Family Medicine | Admitting: Family Medicine

## 2018-04-05 DIAGNOSIS — R928 Other abnormal and inconclusive findings on diagnostic imaging of breast: Secondary | ICD-10-CM | POA: Diagnosis not present

## 2018-04-05 DIAGNOSIS — N6001 Solitary cyst of right breast: Secondary | ICD-10-CM | POA: Diagnosis not present

## 2018-04-05 DIAGNOSIS — N6310 Unspecified lump in the right breast, unspecified quadrant: Secondary | ICD-10-CM

## 2018-04-23 DIAGNOSIS — N183 Chronic kidney disease, stage 3 (moderate): Secondary | ICD-10-CM | POA: Diagnosis not present

## 2018-04-23 DIAGNOSIS — M7582 Other shoulder lesions, left shoulder: Secondary | ICD-10-CM | POA: Diagnosis not present

## 2018-04-23 DIAGNOSIS — I10 Essential (primary) hypertension: Secondary | ICD-10-CM | POA: Diagnosis not present

## 2019-12-09 ENCOUNTER — Other Ambulatory Visit: Payer: Self-pay | Admitting: Family Medicine

## 2019-12-09 ENCOUNTER — Other Ambulatory Visit (HOSPITAL_COMMUNITY)
Admission: RE | Admit: 2019-12-09 | Discharge: 2019-12-09 | Disposition: A | Discharge status: Home / Self Care | Payer: 59 | Source: Ambulatory Visit | Attending: Dermatology | Admitting: Dermatology

## 2019-12-09 DIAGNOSIS — Z0000 Encounter for general adult medical examination without abnormal findings: Secondary | ICD-10-CM | POA: Insufficient documentation

## 2019-12-12 ENCOUNTER — Other Ambulatory Visit: Payer: Self-pay | Admitting: Family Medicine

## 2019-12-12 DIAGNOSIS — Z1231 Encounter for screening mammogram for malignant neoplasm of breast: Secondary | ICD-10-CM

## 2019-12-13 LAB — CYTOLOGY - PAP
Adequacy: ABSENT
Diagnosis: NEGATIVE

## 2020-01-18 ENCOUNTER — Ambulatory Visit: Payer: 59

## 2020-03-08 ENCOUNTER — Ambulatory Visit: Payer: 59

## 2020-03-09 ENCOUNTER — Ambulatory Visit: Payer: 59 | Attending: Internal Medicine

## 2020-03-09 DIAGNOSIS — Z23 Encounter for immunization: Secondary | ICD-10-CM

## 2020-03-09 NOTE — Progress Notes (Signed)
   Covid-19 Vaccination Clinic  Name:  Donna Peterson    MRN: [id number] DOB: 03-24-1960  03/09/2020  Donna Peterson was observed post Covid-19 immunization for 15 minutes without incident. She was provided with Vaccine Information Sheet and instruction to access the V-Safe system.   Donna Peterson was instructed to call 911 with any severe reactions post vaccine: Marland Kitchen Difficulty breathing  . Swelling of face and throat  . A fast heartbeat  . A bad rash all over body  . Dizziness and weakness   Immunizations Administered    Name Date Dose VIS Date Route   Pfizer COVID-19 Vaccine 03/09/2020  5:35 PM 0.3 mL 12/21/2019 Intramuscular   Manufacturer: [place]   Lot: [id number]   NDC: [address]-8

## 2020-04-23 ENCOUNTER — Other Ambulatory Visit: Payer: Self-pay

## 2020-04-23 ENCOUNTER — Ambulatory Visit
Admission: RE | Admit: 2020-04-23 | Discharge: 2020-04-23 | Disposition: A | Discharge status: Home / Self Care | Payer: 59 | Source: Ambulatory Visit | Attending: Family Medicine | Admitting: Family Medicine

## 2020-04-23 DIAGNOSIS — Z1231 Encounter for screening mammogram for malignant neoplasm of breast: Secondary | ICD-10-CM

## 2020-12-21 ENCOUNTER — Emergency Department (HOSPITAL_COMMUNITY)
Admission: EM | Admit: 2020-12-21 | Discharge: 2020-12-21 | Disposition: A | Discharge status: Home / Self Care | Payer: 59 | Attending: Emergency Medicine | Admitting: Emergency Medicine

## 2020-12-21 ENCOUNTER — Encounter (HOSPITAL_COMMUNITY): Payer: Self-pay | Admitting: *Deleted

## 2020-12-21 ENCOUNTER — Other Ambulatory Visit: Payer: Self-pay

## 2020-12-21 DIAGNOSIS — I10 Essential (primary) hypertension: Secondary | ICD-10-CM | POA: Insufficient documentation

## 2020-12-21 DIAGNOSIS — Z7982 Long term (current) use of aspirin: Secondary | ICD-10-CM | POA: Diagnosis not present

## 2020-12-21 DIAGNOSIS — M79604 Pain in right leg: Secondary | ICD-10-CM | POA: Diagnosis present

## 2020-12-21 DIAGNOSIS — J45909 Unspecified asthma, uncomplicated: Secondary | ICD-10-CM | POA: Diagnosis not present

## 2020-12-21 DIAGNOSIS — T148XXA Other injury of unspecified body region, initial encounter: Secondary | ICD-10-CM

## 2020-12-21 MED ORDER — NAPROXEN 500 MG PO TABS: 500.0000 mg | ORAL_TABLET | Freq: Two times a day (BID) | ORAL | 0 refills | Status: DC

## 2020-12-21 MED ORDER — OXYCODONE-ACETAMINOPHEN 5-325 MG PO TABS
1.0000 | ORAL_TABLET | Freq: Once | ORAL | Status: AC
  Administered 2020-12-21: 1 via ORAL
  Filled 2020-12-21: qty 1

## 2020-12-21 NOTE — ED Provider Notes (Signed)
[facility] EMERGENCY DEPARTMENT Provider Note   CSN: [id number] Arrival date & time: 12/21/20  0325     History Chief Complaint  Patient presents with   Leg Pain    Donna Peterson is a 60 y.o. female.  60 year old female presents today for evaluation of right lower extremity pain of 2-day duration.  Patient reports she is fairly active and on Wednesday was working out at [employer] with significant amount of activity that she ended with 100 jumping jacks.  She reports during her jumping jacks she noticed some pain in her right leg which became more pronounced as she was walking out of the gym.  She reports her pain is worse with movements and ambulating.  She reports localized swelling to the outside of her right leg.  She denies fever, dyspnea, inability to bear weight.  She denies recent inactivity, long road trip, or flight.  She does not have history of bleeding disorders or clotting disorders.  She does not have history of cancer.  She has taken ibuprofen over-the-counter with minimal relief.  The history is provided by the patient. No language interpreter was used.      Past Medical History:  Diagnosis Date   Anemia    none recent   Anxiety    Asthma    mild, no inhaler use   Hypertension     There are no problems to display for this patient.   Past Surgical History:  Procedure Laterality Date   cesarian     x 1   COLONOSCOPY WITH PROPOFOL N/A 10/21/2013   Procedure: COLONOSCOPY WITH PROPOFOL;  Surgeon: Jeryl Columbia, MD;  Location: [facility];  Service: Endoscopy;  Laterality: N/A;   COLONOSCOPY WITH PROPOFOL N/A 02/15/2016   Procedure: COLONOSCOPY WITH PROPOFOL;  Surgeon: Clarene Essex, MD;  Location: [facility];  Service: Endoscopy;  Laterality: N/A;   HYSTEROSCOPY     for fibroids     OB History   No obstetric history on file.     No family history on file.  Social History   Tobacco Use   Smoking status: Never   Smokeless tobacco: Never   Substance Use Topics   Alcohol use: No   Drug use: No    Home Medications Prior to Admission medications   Medication Sig Start Date End Date Taking? Authorizing Provider  naproxen (NAPROSYN) 500 MG tablet Take 1 tablet (500 mg total) by mouth 2 (two) times daily. 12/21/20  Yes Deatra Canter, Abimael Zeiter, PA-C  aspirin 325 MG tablet Take 650 mg by mouth every 4 (four) hours as needed for moderate pain.    [provider]  Carboxymethylcellul-Glycerin (LUBRICATING EYE DROPS OP) Apply 1 drop to eye daily as needed (dry eyes).    [provider]  cetirizine (ZYRTEC) 10 MG tablet Take 10 mg by mouth daily as needed for allergies.     [provider]  enalapril-hydrochlorothiazide (VASERETIC) 10-25 MG per tablet Take 2 tablets by mouth every morning.     [provider]  fluticasone (FLONASE) 50 MCG/ACT nasal spray Place 1 spray into both nostrils daily as needed for allergies or rhinitis.     [provider]  LORazepam (ATIVAN) 1 MG tablet Take 1 tablet (1 mg total) by mouth 3 (three) times daily as needed for anxiety. 01/23/12   Helane Rima, MD  nebivolol (BYSTOLIC) 5 MG tablet Take 5 mg by mouth daily as needed (high blood pressure.).     [provider]  norethindrone (AYGESTIN) 5 MG tablet Take 5 mg by mouth every morning.     [provider]  ranitidine (ZANTAC) 150 MG tablet Take 150 mg by mouth daily as needed for heartburn.    [provider]  zolpidem (AMBIEN) 10 MG tablet Take 10 mg by mouth at bedtime as needed for sleep.    [provider]    Allergies    Patient has no known allergies.  Review of Systems   Review of Systems  Constitutional:  Negative for activity change, chills and fever.  Respiratory:  Negative for shortness of breath.   Cardiovascular:  Positive for leg swelling. Negative for chest pain.  Gastrointestinal:  Negative for nausea.  Musculoskeletal:  Positive for gait problem (antalgic  gait). Negative for arthralgias and back pain.  Neurological:  Negative for weakness.  All other systems reviewed and are negative.  Physical Exam Updated Vital Signs BP (!) 148/78   Pulse 80   Temp 98 F (36.7 C) (Oral)   Resp 15   SpO2 99%   Physical Exam Vitals and nursing note reviewed.  Constitutional:      General: She is not in acute distress.    Appearance: Normal appearance. She is not ill-appearing.  HENT:     Head: Normocephalic and atraumatic.     Nose: Nose normal.  Eyes:     Conjunctiva/sclera: Conjunctivae normal.  Cardiovascular:     Rate and Rhythm: Normal rate and regular rhythm.  Pulmonary:     Effort: Pulmonary effort is normal. No respiratory distress.     Breath sounds: Normal breath sounds. No wheezing or rales.  Abdominal:     General: There is no distension.     Tenderness: There is no abdominal tenderness. There is no guarding.  Musculoskeletal:        General: No deformity.     Right lower leg: Edema (localized to distal lateral RLE. without diffuse or pitting edema.) present.     Comments: Patient with visual swelling to right mid lateral fibula. Without diffuse swelling.  Tenderness to palpation present over the localized area of swelling.  Calf without tenderness to palpation.  Right lower extremity pain 5/5 bilaterally and symmetric.  Patient ambulated and observed antalgic gait.  Symptoms reproduced with dorsiflexion and eversion.  Neurovascular intact.  Skin:    Findings: No rash.  Neurological:     Mental Status: She is alert.    ED Results / Procedures / Treatments   Labs (all labs ordered are listed, but only abnormal results are displayed) Labs Reviewed - No data to display  EKG None  Radiology No results found.  Procedures Procedures   Medications Ordered in ED Medications  oxyCODONE-acetaminophen (PERCOCET/ROXICET) 5-325 MG per tablet 1 tablet (1 tablet Oral Given 12/21/20 0801)    ED Course  I have reviewed the  triage vital signs and the nursing notes.  Pertinent labs & imaging results that were available during my care of the patient were reviewed by me and considered in my medical decision making (see chart for details).    MDM Rules/Calculators/A&P                           60 year old female presents today following injury to right lower extremity following her workout.  Patient has localized swelling to her mid fibular area.  History without risk factors for DVT.  Without prior bleeding or clotting history.  She most likely suffered a  MSK injury.  Naproxen prescribed.  Discussed importance of RICE.  Podiatry follow-up given the patient.  Discussed importance of follow-up with podiatry and PCP.  Discussed limit weightbearing until podiatry follow-up.  Crutches given.  Patient voices understanding and is in agreement with plan.  Return precautions discussed.  Final Clinical Impression(s) / ED Diagnoses Final diagnoses:  Right leg pain  Muscle strain    Rx / DC Orders ED Discharge Orders          Ordered    naproxen (NAPROSYN) 500 MG tablet  2 times daily        12/21/20 0757             Evlyn Courier, PA-C 12/21/20 8721    Lennice Sites, DO 12/21/20 1017

## 2020-12-21 NOTE — ED Triage Notes (Signed)
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 Reports having some mild pain in R calf during exercises but the pain has persisted since Wednesday. Denies recent travel

## 2020-12-21 NOTE — Discharge Instructions (Addendum)
As discussed you likely have a muscular injury to your right lower leg following your workout in the gym on Wednesday.  Take naproxen as discussed.  Use crutches to keep weight off.  Ice for 15 to 20 minutes every 3 hours.  Call podiatry office to get a follow-up appointment scheduled.  If you develop worsening swelling, pain, difficulty breathing please return to the emergency room and we can evaluate for DVT at that time.

## 2021-01-30 ENCOUNTER — Encounter: Payer: Self-pay | Admitting: Podiatry

## 2021-01-30 ENCOUNTER — Ambulatory Visit: Payer: 59 | Admitting: Podiatry

## 2021-01-30 ENCOUNTER — Other Ambulatory Visit: Payer: Self-pay

## 2021-01-30 DIAGNOSIS — G5791 Unspecified mononeuropathy of right lower limb: Secondary | ICD-10-CM

## 2021-01-30 MED ORDER — CYCLOBENZAPRINE HCL 10 MG PO TABS: 10.0000 mg | ORAL_TABLET | Freq: Three times a day (TID) | ORAL | 0 refills | Status: AC

## 2021-01-30 MED ORDER — GABAPENTIN 100 MG PO CAPS: 100.0000 mg | ORAL_CAPSULE | Freq: Three times a day (TID) | ORAL | 0 refills | Status: AC | PRN

## 2021-01-30 NOTE — Progress Notes (Signed)
   HPI: 59 y.o. female presenting today as a new patient for evaluation of right leg and foot pain.  Patient states that on 12/18/2020 she was exercising and doing standing stationary sprints and jumping jacks when she sustained some pain and tenderness to the right leg and foot.  She went to the emergency department as well as [facility] physicians urgent care with no improvement.  She was given some anti-inflammatory naproxen and recommended ice.  She presents for further treatment and evaluation  Past Medical History:  Diagnosis Date   Anemia    none recent   Anxiety    Asthma    mild, no inhaler use   Hypertension      Physical Exam: General: The patient is alert and oriented x3 in no acute distress.  Dermatology: Skin is warm, dry and supple bilateral lower extremities. Negative for open lesions or macerations.   Vascular: Palpable pedal pulses bilaterally. No edema or erythema noted. Capillary refill within normal limits.  Neurological: Epicritic and protective threshold grossly intact bilaterally.  Paresthesia with pins-and-needles and burning noted to the lateral aspect of the right leg as it inserts onto the right foot as well  Musculoskeletal Exam: Range of motion within normal limits to all pedal and ankle joints bilateral. Muscle strength 5/5 in all groups bilateral.  There is some tenderness to palpation along the lateral aspect of the leg and foot  Radiographic exam taken at [place] [facility] 12/22/2020: Unremarkable radiographs of the right lower leg.  No radiographic explanation for symptoms.   Assessment: 1.  Neuritis right leg and foot secondary to exercising.  DOI: 12/18/2020 2.  Intermittent muscle cramping right leg and foot   Plan of Care:  1. Patient evaluated.  2.  Prescription for Flexeril 10 mg 3 times daily as needed muscle cramping 3.  Prescription for gabapentin 100 mg 3 times daily 4.  I explained that I do believe the patient is suffering  from irritation of a nerve.  It follows the lateral compartment of the leg into the foot.  Patient describes the pain as pins-and-needles and burning sensation.  Do believe a combination of Flexeril and gabapentin will help suppress the symptoms and reduce the inflammation of the nerve 5.  Return to clinic in 3 weeks  *Exercises with a personal trainer      Edrick Kins, DPM [facility]  Dr. Edrick Kins, DPM    [address], [place] [address]                Office [phone]  Fax [phone]

## 2021-02-20 ENCOUNTER — Ambulatory Visit: Payer: 59 | Admitting: Podiatry

## 2021-02-20 ENCOUNTER — Other Ambulatory Visit: Payer: Self-pay

## 2021-02-20 DIAGNOSIS — G5791 Unspecified mononeuropathy of right lower limb: Secondary | ICD-10-CM

## 2021-02-20 NOTE — Progress Notes (Signed)
° °  HPI: 60 y.o. female presenting today for follow-up evaluation of neuritis to the right leg and foot.  Patient states that on 12/18/2020 she was exercising and doing standing stationary sprints and jumping jacks when she experienced some instant pain and tenderness to the right leg and foot.  She was evaluated by me on 01/30/2021 at which time Flexeril and gabapentin was prescribed short-term.  Patient states that she is significantly better.  Today she is wearing good supportive shoes and sneakers  Past Medical History:  Diagnosis Date   Anemia    none recent   Anxiety    Asthma    mild, no inhaler use   Hypertension     Past Surgical History:  Procedure Laterality Date   cesarian     x 1   COLONOSCOPY WITH PROPOFOL N/A 10/21/2013   Procedure: COLONOSCOPY WITH PROPOFOL;  Surgeon: Jeryl Columbia, MD;  Location: [facility];  Service: Endoscopy;  Laterality: N/A;   COLONOSCOPY WITH PROPOFOL N/A 02/15/2016   Procedure: COLONOSCOPY WITH PROPOFOL;  Surgeon: Clarene Essex, MD;  Location: [facility];  Service: Endoscopy;  Laterality: N/A;   HYSTEROSCOPY     for fibroids    No Known Allergies   Physical Exam: General: The patient is alert and oriented x3 in no acute distress.  Dermatology: Skin is warm, dry and supple bilateral lower extremities. Negative for open lesions or macerations.  Vascular: Palpable pedal pulses bilaterally. Capillary refill within normal limits.  Negative for any significant edema or erythema  Neurological: Light touch and protective threshold grossly intact.  The paresthesia with light touch along the lateral aspect of the leg and foot has improved significantly.  Musculoskeletal Exam: No pedal deformities noted.  Muscle strength 5/5 all compartments of the foot and leg  Assessment: 1.  Neuritis right leg and foot secondary to exercise.  DOI: 12/18/2020 2.  Intermittent muscle cramping right leg and foot   Plan of Care:  1. Patient evaluated.  2.  The  patient states that she is significantly improved.  She may now transition back into activity.  Slow transition to activity.  Avoid explosive movements or exercises for an additional month 3.  Continue Flexeril 10 mg and gabapentin 100 mg nightly. 4.  Patient may be full activity no restrictions beginning 4 weeks from today 5.  Return to clinic as needed      Edrick Kins, DPM [facility]  Dr. Edrick Kins, DPM    [address], [place] [address]                Office [phone]  Fax [phone]

## 2021-03-20 ENCOUNTER — Other Ambulatory Visit: Payer: Self-pay

## 2021-03-20 ENCOUNTER — Ambulatory Visit: Payer: 59 | Admitting: Podiatry

## 2021-03-20 DIAGNOSIS — G5791 Unspecified mononeuropathy of right lower limb: Secondary | ICD-10-CM | POA: Diagnosis not present

## 2021-03-29 NOTE — Progress Notes (Signed)
° °  HPI: 61 y.o. female presenting today for follow-up evaluation of neuritis to the right leg and foot.  Patient states that on 12/18/2020 she was exercising and doing standing stationary sprints and jumping jacks when she experienced some instant pain and tenderness to the right leg and foot.  Patient states that she still feels some slightly numbness and tingling on top of her toes.  She says the gabapentin has helped.  Overall she is doing significantly better.  She has full strength and normal range of motion.  Past Medical History:  Diagnosis Date   Anemia    none recent   Anxiety    Asthma    mild, no inhaler use   Hypertension     Past Surgical History:  Procedure Laterality Date   cesarian     x 1   COLONOSCOPY WITH PROPOFOL N/A 10/21/2013   Procedure: COLONOSCOPY WITH PROPOFOL;  Surgeon: Jeryl Columbia, MD;  Location: [facility];  Service: Endoscopy;  Laterality: N/A;   COLONOSCOPY WITH PROPOFOL N/A 02/15/2016   Procedure: COLONOSCOPY WITH PROPOFOL;  Surgeon: Clarene Essex, MD;  Location: [facility];  Service: Endoscopy;  Laterality: N/A;   HYSTEROSCOPY     for fibroids    No Known Allergies   Physical Exam: General: The patient is alert and oriented x3 in no acute distress.  Dermatology: Skin is warm, dry and supple bilateral lower extremities. Negative for open lesions or macerations.  Vascular: Palpable pedal pulses bilaterally. Capillary refill within normal limits.  Negative for any significant edema or erythema  Neurological: Light touch and protective threshold grossly intact.  The paresthesia with light touch along the lateral aspect of the leg and foot has improved significantly however she continues to feel some slight numbness and tingling.  Musculoskeletal Exam: No pedal deformities noted.  Muscle strength 5/5 all compartments of the foot and leg  Assessment: 1.  Neuritis right leg and foot secondary to exercise.  DOI: 12/18/2020 2.  Intermittent muscle  cramping right leg and foot   Plan of Care:  1. Patient evaluated.  2.  Patient has concerns today and questions regarding returning to the gym.  I recommend that the patient may return to the gym and slowly increase activity over the next 4 weeks 3.  After 4 weeks the patient should be able to return to full activity no restrictions 4.  Return to clinic as needed      Edrick Kins, DPM [facility]  Dr. Edrick Kins, DPM    [address], [place] [address]                Office [phone]  Fax [phone]

## 2021-10-07 IMAGING — MG MM DIGITAL SCREENING BILAT W/ TOMO AND CAD
8 of 15 series · 8 of 40 positions shown · non-contrast
Comparison: Previous exam(s).

CLINICAL DATA: Screening.

EXAM:
DIGITAL SCREENING BILATERAL MAMMOGRAM WITH TOMOSYNTHESIS AND CAD
TECHNIQUE: Bilateral screening digital craniocaudal and mediolateral oblique
mammograms were obtained. Bilateral screening digital breast
tomosynthesis was performed. The images were evaluated with
computer-aided detection.

[R CV synth-2D]
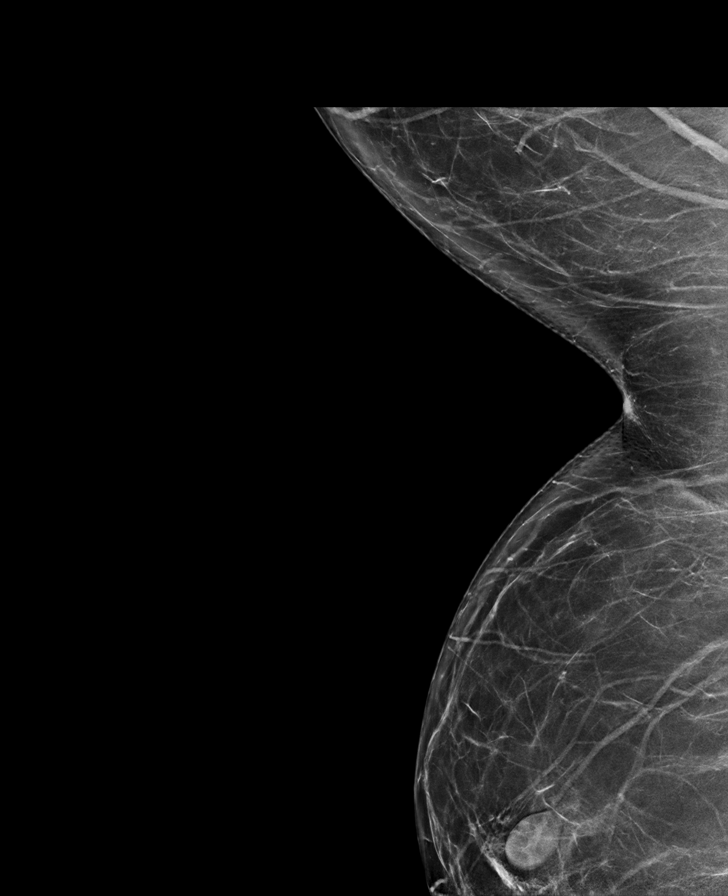

[L CC synth-2D (1 of 2)]
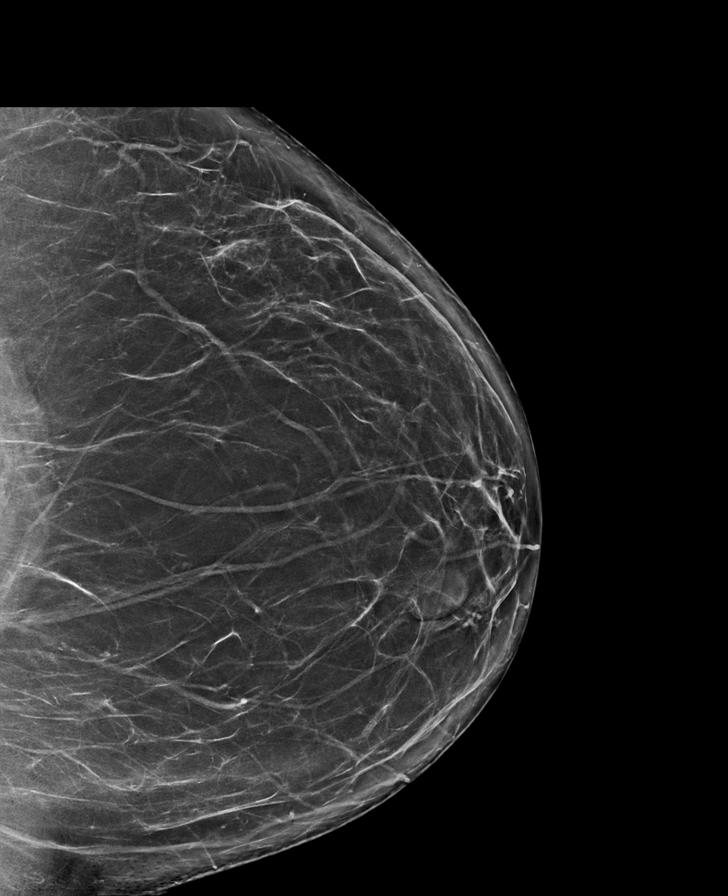

[R CC synth-2D]
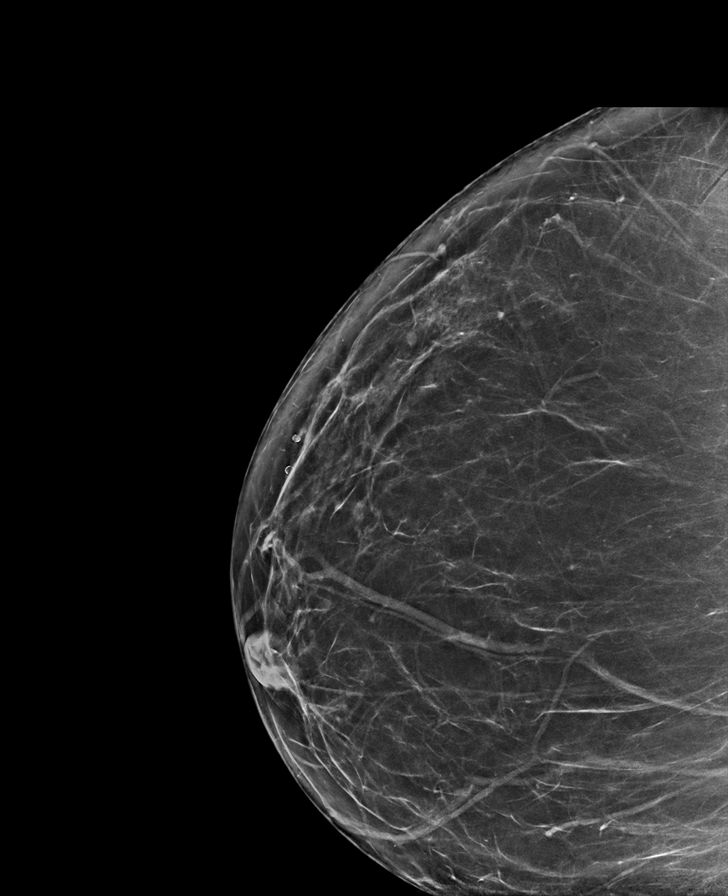

[L MLO synth-2D (1 of 2)]
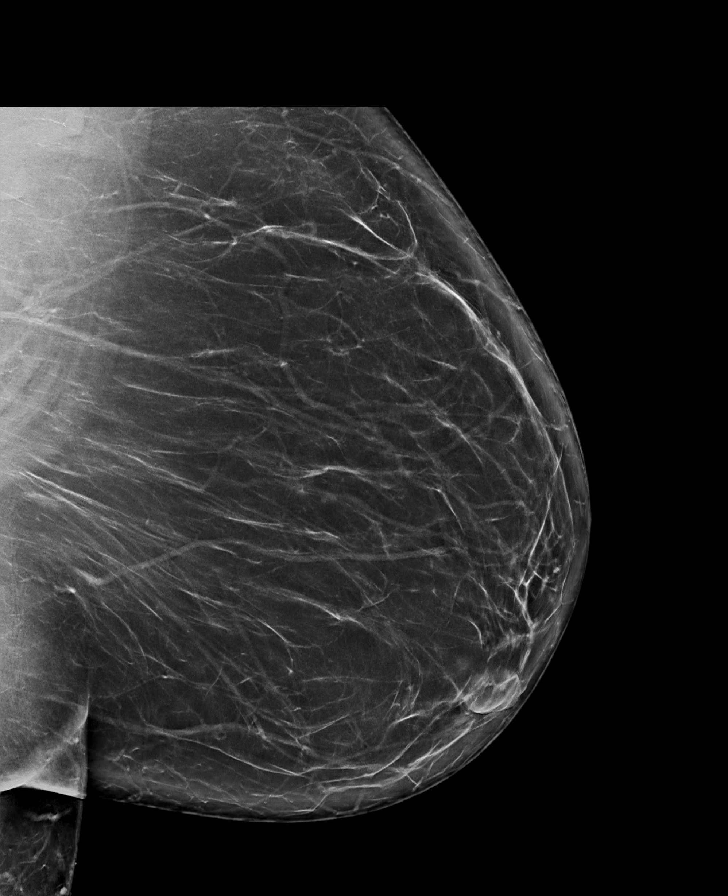

[L MLO synth-2D (2 of 2)]
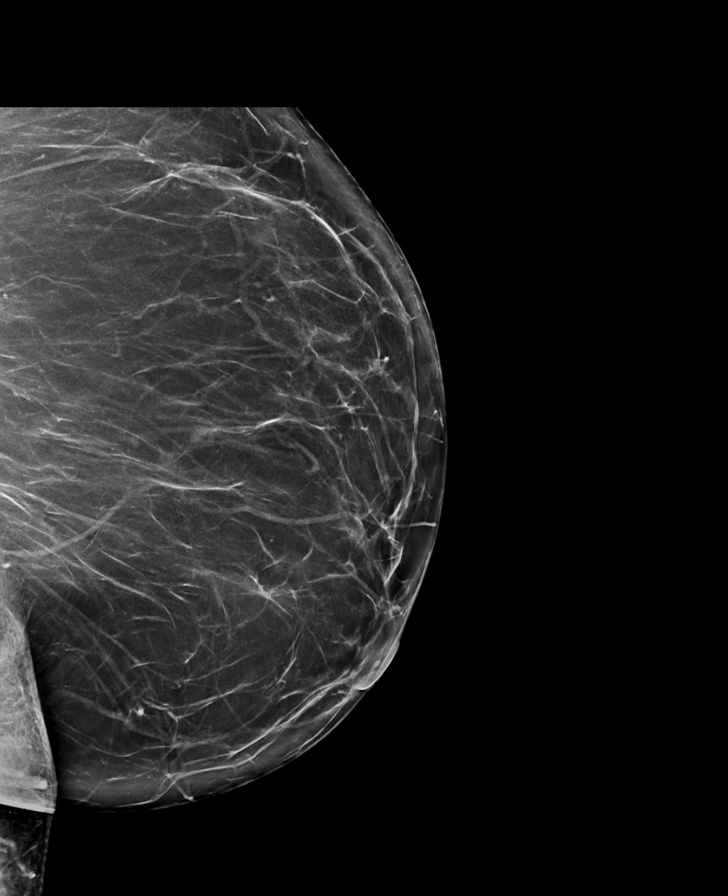

[R MLO synth-2D]
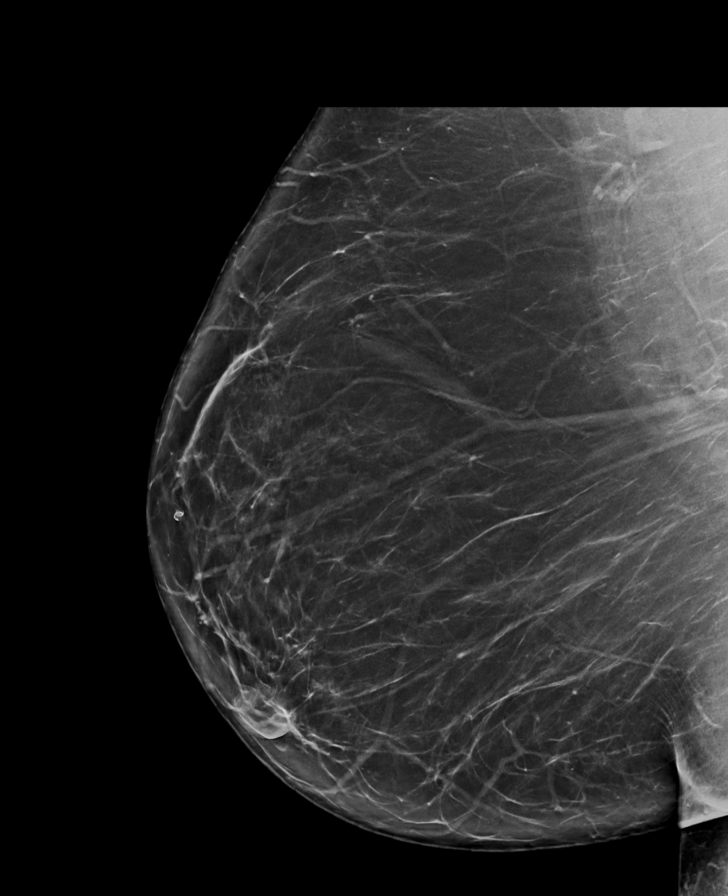

[L CC synth-2D (2 of 2)]
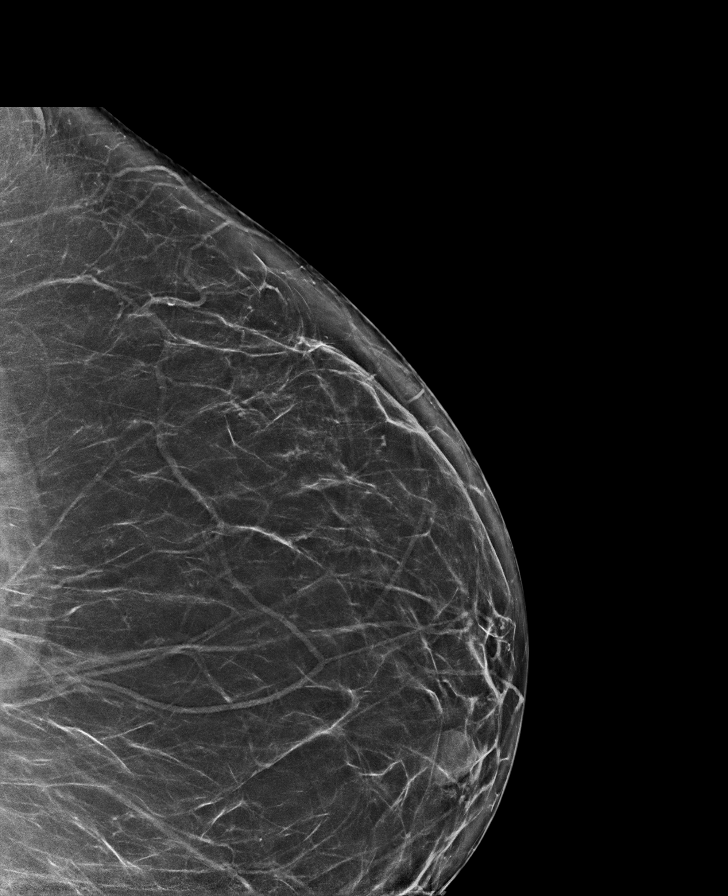

[R MLO tomo · tomo slice 67/98.0]
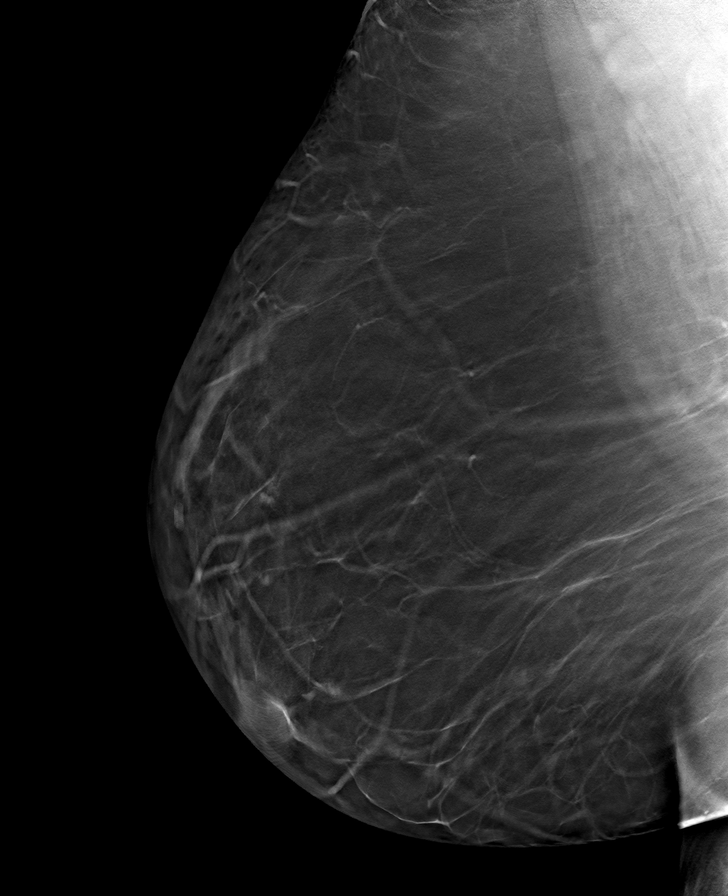

[8 of 40 positions shown; findings below may reference images not displayed]

ACR Breast Density Category b: There are scattered areas of
fibroglandular density.
FINDINGS: There are no findings suspicious for malignancy.
IMPRESSION: No mammographic evidence of malignancy. A result letter of this
screening mammogram will be mailed directly to the patient.

RECOMMENDATION:
Screening mammogram in one year. (Code:51-O-LD2)

BI-RADS CATEGORY  1: Negative.

## 2021-12-18 ENCOUNTER — Other Ambulatory Visit: Payer: Self-pay | Admitting: Family Medicine

## 2021-12-18 DIAGNOSIS — Z1231 Encounter for screening mammogram for malignant neoplasm of breast: Secondary | ICD-10-CM

## 2022-07-15 ENCOUNTER — Ambulatory Visit
Admission: RE | Admit: 2022-07-15 | Discharge: 2022-07-15 | Disposition: A | Discharge status: Home / Self Care | Payer: 59 | Source: Ambulatory Visit | Attending: Family Medicine | Admitting: Family Medicine

## 2022-07-15 DIAGNOSIS — Z1231 Encounter for screening mammogram for malignant neoplasm of breast: Secondary | ICD-10-CM

## 2022-12-31 ENCOUNTER — Other Ambulatory Visit: Payer: Self-pay | Admitting: Family Medicine

## 2022-12-31 ENCOUNTER — Ambulatory Visit
Admission: RE | Admit: 2022-12-31 | Discharge: 2022-12-31 | Disposition: A | Discharge status: Home / Self Care | Payer: 59 | Source: Ambulatory Visit | Attending: Family Medicine | Admitting: Family Medicine

## 2022-12-31 DIAGNOSIS — R0602 Shortness of breath: Secondary | ICD-10-CM

## 2022-12-31 DIAGNOSIS — M545 Low back pain, unspecified: Secondary | ICD-10-CM

## 2023-05-13 ENCOUNTER — Other Ambulatory Visit: Payer: Self-pay

## 2023-05-13 ENCOUNTER — Encounter (HOSPITAL_BASED_OUTPATIENT_CLINIC_OR_DEPARTMENT_OTHER): Payer: Self-pay | Admitting: Physical Therapy

## 2023-05-13 ENCOUNTER — Ambulatory Visit (HOSPITAL_BASED_OUTPATIENT_CLINIC_OR_DEPARTMENT_OTHER): Payer: 59 | Attending: Sports Medicine | Admitting: Physical Therapy

## 2023-05-13 DIAGNOSIS — M5459 Other low back pain: Secondary | ICD-10-CM | POA: Insufficient documentation

## 2023-05-13 DIAGNOSIS — R293 Abnormal posture: Secondary | ICD-10-CM | POA: Diagnosis present

## 2023-05-13 DIAGNOSIS — M6281 Muscle weakness (generalized): Secondary | ICD-10-CM | POA: Insufficient documentation

## 2023-05-13 NOTE — Therapy (Signed)
 OUTPATIENT PHYSICAL THERAPY THORACOLUMBAR EVALUATION   Patient Name: Donna Peterson MRN: [id number] DOB:1960/05/06, 63 y.o., female Today's Date: 05/13/2023  END OF SESSION:  PT End of Session - 05/13/23 1809     Visit Number 1    Number of Visits 16    Date for PT Re-Evaluation 07/10/23    Authorization Type [employer]    PT Start Time 1532    PT Stop Time 1613    PT Time Calculation (min) 41 min    Activity Tolerance Patient tolerated treatment well    Behavior During Therapy [facility] for tasks assessed/performed             Past Medical History:  Diagnosis Date   Anemia    none recent   Anxiety    Asthma    mild, no inhaler use   Hypertension    Past Surgical History:  Procedure Laterality Date   cesarian     x 1   COLONOSCOPY WITH PROPOFOL N/A 10/21/2013   Procedure: COLONOSCOPY WITH PROPOFOL;  Surgeon: Petra Kuba, MD;  Location: [facility];  Service: Endoscopy;  Laterality: N/A;   COLONOSCOPY WITH PROPOFOL N/A 02/15/2016   Procedure: COLONOSCOPY WITH PROPOFOL;  Surgeon: Vida Rigger, MD;  Location: [facility];  Service: Endoscopy;  Laterality: N/A;   HYSTEROSCOPY     for fibroids   There are no active problems to display for this patient.   PCP: Johny Blamer MD  REFERRING PROVIDER: Hurman Horn, MD   REFERRING DIAG: M54.50 (ICD-10-CM) - Low back pain   Rationale for Evaluation and Treatment: Rehabilitation  THERAPY DIAG:  Other low back pain  Abnormal posture  Muscle weakness (generalized)  ONSET DATE: 2 years  SUBJECTIVE:                                                                                                                                                                                           SUBJECTIVE STATEMENT: LBP x 2 years, covid a few years ago worked at home stopped exercising gained weight.  Really has bothered me since.  Has progressively gotten worse.  Have tried different meds with any improvement.  I can't tolerate the gym to  exercise.  I have started with healty weight management to help lose weight.   PERTINENT HISTORY:  Dr Freida Busman- Patient presents with persistent low back pain, unimproved by previous treatments. Celebrex and baclofen were ineffective, with baclofen causing drowsiness. X-ray shows multiple levels of arthritis. NSAIDs are intolerable due to kidney concerns. PT is scheduled, but pain is limiting participation.   PAIN:  Are you having pain? Yes: NPRS scale: current 0/10, worst  7/10 Pain location: LB with radiation to left mid calf Pain description: ache, radiating  Aggravating factors: standing 5-10 minutes, walking 15 minutes Relieving factors: sitting resting, lying down, leaning on cart, getting OOB or donning left pants  PRECAUTIONS: None  RED FLAGS: None   WEIGHT BEARING RESTRICTIONS: No  FALLS:  Has patient fallen in last 6 months? 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  NEXT MD VISIT: end of April  OBJECTIVE:  Note: Objective measures were completed at Evaluation unless otherwise noted.  DIAGNOSTIC FINDINGS:  DG Lumbar spine IMPRESSION: 1. Mild multilevel lumbar spine DDD, worse at L2-L3 and L3-L4. 2. Aortic Atherosclerosis (ICD10-I70.0).  PATIENT SURVEYS:  Modified Oswestry 11/45=24%   COGNITION: Overall cognitive status: Within functional limits for tasks assessed     SENSATION: WFL  MUSCLE LENGTH: Hamstrings: Full knee ext R/L/tight   POSTURE: rounded shoulders and forward head  PALPATION: No TTP body habitus inhibits  LUMBAR ROM:   AROM eval  Flexion Full P!  Extension 50% limited P!  Right lateral flexion 75% limited P!  Left lateral flexion 75% P!  Right rotation   Left rotation    (Blank rows = not  tested)  LOWER EXTREMITY ROM:     wfl  LOWER EXTREMITY MMT:    MMT Right eval Left eval  Hip flexion 50.4 22.0  Hip extension    Hip abduction 27.6 29.3  Hip adduction    Hip internal rotation    Hip external rotation    Knee flexion    Knee extension 35.8 42.8  Ankle dorsiflexion    Ankle plantarflexion    Ankle inversion    Ankle eversion     (Blank rows = not tested)  LUMBAR SPECIAL TESTS:   Trendelenburg sign neg: Negative slump test neg L  FUNCTIONAL TESTS:  5 times sit to stand: 17.54 hurt but felt good Timed up and go (TUG): 12.07  GAIT: Distance walked: 400 ft Assistive device utilized: None Level of assistance: Complete Independence Comments: normal pattern initially.  As pt tires pt falls into forward flexed posture, minimal arm swing  TREATMENT  Eval Self care                                                                                                                                 PATIENT EDUCATION:  Education details: Discussed eval findings, rehab rationale, aquatic program progression/POC and pools in area. Patient is in agreement  Person educated: Patient Education method: Explanation Education comprehension: verbalized understanding  HOME EXERCISE PROGRAM: TBA  ASSESSMENT:  CLINICAL IMPRESSION: Patient is a 63 y.o. f who was seen today for physical therapy evaluation and treatment for LBP. She presents without ad.  Reports pain mostly lumbar spine  with some radiation into bilat hips with radiation occasionally down mid calf presenting as a sharp pain nothing that lingers.  Testing demonstrates pt with poor posture, forward leaning.  Lumbar ROM limited mostly in side bending.  She does have pain with returning to vertical from full flexion.  She has core and left hip weakness.  Diagnostics state mild lumber DDD.  I do suspect her pain is mostly due to muscle weakness and poor posture which has slowly increased over the past few months. She  will benefit from skilled PT intervention to improve deficits and return her to PLOF.  Will begin in aquatics then transition to land as pain is better managed and strength in core and left hip improves.  Will also focus on posture and balance.  OBJECTIVE IMPAIRMENTS: decreased activity tolerance, decreased balance, decreased endurance, decreased mobility, decreased ROM, decreased strength, obesity, and pain.   ACTIVITY LIMITATIONS: carrying, lifting, bending, squatting, bathing, and locomotion level  PARTICIPATION LIMITATIONS: meal prep, cleaning, laundry, shopping, community activity, and yard work  PERSONAL FACTORS: Fitness and 1-2 comorbidities: see problem list  are also affecting patient's functional outcome.   REHAB POTENTIAL: Good  CLINICAL DECISION MAKING: Evolving/moderate complexity  EVALUATION COMPLEXITY: Moderate   GOALS: Goals reviewed with patient? Yes  SHORT TERM GOALS: Target date: 06/11/23  Pt will tolerate full aquatic sessions consistently without increase in pain and with improving function to demonstrate good toleration and effectiveness of intervention.  Baseline: Goal status: INITIAL  2.  Pt will report a 75% decrease in pain with submersion and exercise while engaged Baseline: TBA Goal status: INITIAL  3.  Increase toleration to treadmill at home to 15 minutes and complete 3-4 times weekly Baseline: <10 min Goal status: INITIAL    LONG TERM GOALS: Target date: 07/10/23  Pt to improve on ODI to </= 10% to demonstrate statistically significant Improvement in function. Baseline: 11/45=24% Goal status: INITIAL  2.  Pt will be indep with final HEP's (land and aquatic as appropriate) for continued management of condition Baseline: attempts to walk on treadmill at home Goal status: INITIAL  3.  Pt will improve on 5 X STS test to <or=  13s  to demonstrate improving functional lower extremity strength, transitional movements, and balance Baseline:  17.54 Goal status: INITIAL  4.  Pt will return to full lumbar ROM without pain. Baseline: see chart Goal status: INITIAL  5.  Pt will improve strength of left hip flex to at least 10lb from contralateral side Baseline:see chart Goal status: INITIAL  6.  Pt will report walking through grocery store in upright position x 30 minutes Baseline:  Goal status: INITIAL  PLAN:  PT FREQUENCY: 2x/week  PT DURATION: 8 weeks  PLANNED INTERVENTIONS: 97164- PT Re-evaluation, 97110-Therapeutic exercises, 97530- Therapeutic activity, 97112- Neuromuscular re-education, 97535- Self Care, 60454- Manual therapy, [phone]- Gait training, [phone]- Orthotic Fit/training, [phone]- Aquatic Therapy, [phone]- Electrical stimulation (unattended), [phone]- Ionotophoresis 4mg /ml Dexamethasone, Patient/Family education, Balance training, Stair training, Taping, Dry Needling, Joint mobilization, DME instructions, Cryotherapy, and Moist heat.  PLAN FOR NEXT SESSION: left hip and core strengthening, posture retraining, balance, endurance, body mechanics instruction,   Rushie Chestnut) Reno Clasby MPT 05/13/23 6:11 PM [facility] [address] [place], [place], [address] Phone: [phone]   Fax:  [phone]  Date of referral: 04/11/23 (Pt cancelled 2 evaluation appts) Referring provider: Hurman Horn, MD  Referring diagnosis? M54.50 (ICD-10-CM) - Low back pain  Treatment diagnosis? (if different than referring diagnosis) M54.50 (ICD-10-CM) -  Low back pain   What was this (referring dx) caused by? Ongoing Issue  Ashby Dawes of Condition: Chronic (continuous duration > 3 months)   Laterality: Both  Current Functional Measure Score: Other ODI 24%  Objective measurements identify impairments when they are compared to normal values, the uninvolved extremity, and prior level of function.  [x]  Yes  []  No  Objective assessment of functional ability: Moderate functional  limitations   Briefly describe symptoms: pain across low back with occasional raditation lle  How did symptoms start: Unknown  Average pain intensity:  Last 24 hours: 3-4/10  Past week: 3-4/10  How often does the pt experience symptoms? Frequently  How much have the symptoms interfered with usual daily activities? Moderately  How has condition changed since care began at this facility? NA - initial visit  In general, how is the patients overall health? Good   BACK PAIN (STarT Back Screening Tool) Has pain spread down the leg(s) at some time in the last 2 weeks? yes Has there been pain in the shoulder or neck at some time in the last 2 weeks? no Has the pt only walked short distances because of back pain? yes Has patient dressed more slowly because of back pain in the past 2 weeks? yes Does patient think it's not safe for a person with this condition to be physically active? no Does patient have worrying thoughts a lot of the time? no Does patient feel back pain is terrible and will never get any better? no Has patient stopped enjoying things they usually enjoy? no

## 2023-06-02 ENCOUNTER — Encounter (HOSPITAL_BASED_OUTPATIENT_CLINIC_OR_DEPARTMENT_OTHER): Payer: Self-pay | Admitting: Physical Therapy

## 2023-06-02 ENCOUNTER — Ambulatory Visit (HOSPITAL_BASED_OUTPATIENT_CLINIC_OR_DEPARTMENT_OTHER): Attending: Sports Medicine | Admitting: Physical Therapy

## 2023-06-02 DIAGNOSIS — M5459 Other low back pain: Secondary | ICD-10-CM | POA: Diagnosis present

## 2023-06-02 DIAGNOSIS — R293 Abnormal posture: Secondary | ICD-10-CM | POA: Insufficient documentation

## 2023-06-02 DIAGNOSIS — M6281 Muscle weakness (generalized): Secondary | ICD-10-CM | POA: Insufficient documentation

## 2023-06-02 NOTE — Therapy (Signed)
 OUTPATIENT PHYSICAL THERAPY THORACOLUMBAR TREATMENT   Patient Name: Donna Peterson MRN: [id number] DOB:November 27, 1960, 63 y.o., female Today's Date: 06/02/2023  END OF SESSION:  PT End of Session - 06/02/23 1710     Visit Number 2    Number of Visits 16    Date for PT Re-Evaluation 07/10/23    Authorization Type U[employer]    PT Start Time 1700    PT Stop Time 1739    PT Time Calculation (min) 39 min    Behavior During Therapy WFL for tasks assessed/performed             Past Medical History:  Diagnosis Date   Anemia    none recent   Anxiety    Asthma    mild, no inhaler use   Hypertension    Past Surgical History:  Procedure Laterality Date   cesarian     x 1   COLONOSCOPY WITH PROPOFOL N/A 10/21/2013   Procedure: COLONOSCOPY WITH PROPOFOL;  Surgeon: Petra Kuba, MD;  Location: [facility];  Service: Endoscopy;  Laterality: N/A;   COLONOSCOPY WITH PROPOFOL N/A 02/15/2016   Procedure: COLONOSCOPY WITH PROPOFOL;  Surgeon: Vida Rigger, MD;  Location: [facility];  Service: Endoscopy;  Laterality: N/A;   HYSTEROSCOPY     for fibroids   There are no active problems to display for this patient.   PCP: Johny Blamer MD  REFERRING PROVIDER: Hurman Horn, MD   REFERRING DIAG: M54.50 (ICD-10-CM) - Low back pain   Rationale for Evaluation and Treatment: Rehabilitation  THERAPY DIAG:  Other low back pain  Abnormal posture  Muscle weakness (generalized)  ONSET DATE: 2 years  SUBJECTIVE:                                                                                                                                                                                           SUBJECTIVE STATEMENT: Pt reports she does not know how to swim and has not done aquatic therapy before.    From initial evaluation:  LBP x 2 years, covid a few years ago worked at home stopped exercising gained weight.  Really has bothered me since.  Has progressively gotten worse.  Have tried different  meds with any improvement.  I can't tolerate the gym to exercise.  I have started with healty weight management to help lose weight.   PERTINENT HISTORY:  Dr Freida Busman- Patient presents with persistent low back pain, unimproved by previous treatments. Celebrex and baclofen were ineffective, with baclofen causing drowsiness. X-ray shows multiple levels of arthritis. NSAIDs are intolerable due to kidney concerns. PT is scheduled, but pain is limiting participation.  PAIN:  Are you having pain? Yes: NPRS scale: 7-8/10 Pain location: LB with radiation to Rt thigh Pain description: ache, radiating  Aggravating factors: standing 5-10 minutes, walking 15 minutes Relieving factors: sitting resting, lying down, leaning on cart, getting OOB or donning left pants  PRECAUTIONS: None  RED FLAGS: None   WEIGHT BEARING RESTRICTIONS: No  FALLS:  Has patient fallen in last 6 months? 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  NEXT MD VISIT: end of April  OBJECTIVE:  Note: Objective measures were completed at Evaluation unless otherwise noted.  DIAGNOSTIC FINDINGS:  DG Lumbar spine IMPRESSION: 1. Mild multilevel lumbar spine DDD, worse at L2-L3 and L3-L4. 2. Aortic Atherosclerosis (ICD10-I70.0).  PATIENT SURVEYS:  Modified Oswestry 11/45=24%   COGNITION: Overall cognitive status: Within functional limits for tasks assessed     SENSATION: WFL  MUSCLE LENGTH: Hamstrings: Full knee ext R/L/tight   POSTURE: rounded shoulders and forward head  PALPATION: No TTP body habitus inhibits  LUMBAR ROM:   AROM eval  Flexion Full P!  Extension 50% limited P!  Right lateral flexion 75% limited P!  Left lateral flexion 75% P!  Right rotation   Left rotation     (Blank rows = not tested)  LOWER EXTREMITY ROM:     wfl  LOWER EXTREMITY MMT:    MMT Right eval Left eval  Hip flexion 50.4 22.0  Hip extension    Hip abduction 27.6 29.3  Hip adduction    Hip internal rotation    Hip external rotation    Knee flexion    Knee extension 35.8 42.8  Ankle dorsiflexion    Ankle plantarflexion    Ankle inversion    Ankle eversion     (Blank rows = not tested)  LUMBAR SPECIAL TESTS:   Trendelenburg sign neg: Negative slump test neg L  FUNCTIONAL TESTS:  5 times sit to stand: 17.54 hurt but felt good Timed up and go (TUG): 12.07  GAIT: Distance walked: 400 ft Assistive device utilized: None Level of assistance: Complete Independence Comments: normal pattern initially.  As pt tires pt falls into forward flexed posture, minimal arm swing  TREATMENT  [facility] Adult PT Treatment:                                                DATE: 06/02/23 Pt seen for aquatic therapy today.  Treatment took place in water 3.5-4.75 ft in depth at the [facility] pool. Temp of water was 91.  Pt entered/exited the pool via stairs independently with bilat rail.  - Intro to aquatic therapy principles - UE on barbell:  walking forward with cues to relax LE and allow knees to bend - UE on barbell: walking forward/ backward next to wall, then walking backward across width of pool - UE on barbell: side stepping x 3 laps  - unsupported next to wall: walking forward/ backwards  - UE on wall: relaxed squats for relief in back x 5 - side stepping with arm addct/ abdct  - UE on wall:  heel raises x 10; hip add/abd x 10 ; hip flexion /extension x 10 - marching forward with  reciprocal row with yellow hand floats x 2 laps  Pt requires the buoyancy and hydrostatic pressure of water for support, and to offload joints by unweighting joint load by at least 50 % in navel deep water and by at least 75-80% in chest to neck deep water.  Viscosity of the water is needed for  resistance of strengthening. Water current perturbations provides challenge to standing balance requiring increased core activation.                                                                                                                              PATIENT EDUCATION:  Education details:intro to aquatic therapy   Person educated: Patient Education method: Explanation Education comprehension: verbalized understanding  HOME EXERCISE PROGRAM: TBA  ASSESSMENT:  CLINICAL IMPRESSION: Pt demonstrates safety and independence in aquatic setting with therapist instructing from deck. Pt initially guarded at beginning of session, and required UE support on floatation. Able to progress towards no UE support with improved confidence.  Pt reported reduction of back pain to 4/10 towards end of session.  Required one small rest break in relaxed squat for relief in back. Rhyen is a good candidate for aquatic therapy and will benefit from the properties of water to progress towards land based goals.  Goals are ongoing.       Initial impression: Patient is a 63 y.o. f who was seen today for physical therapy evaluation and treatment for LBP. She presents without ad.  Reports pain mostly lumbar spine with some radiation into bilat hips with radiation occasionally down mid calf presenting as a sharp pain nothing that lingers.  Testing demonstrates pt with poor posture, forward leaning.  Lumbar ROM limited mostly in side bending.  She does have pain with returning to vertical from full flexion.  She has core and left hip weakness.  Diagnostics state mild lumber DDD.  I do suspect her pain is mostly due to muscle weakness and poor posture which has slowly increased over the past few months. She will benefit from skilled PT intervention to improve deficits and return her to PLOF.  Will begin in aquatics then transition to land as pain is better managed and strength in core and left hip improves.  Will also  focus on posture and balance.  OBJECTIVE IMPAIRMENTS: decreased activity tolerance, decreased balance, decreased endurance, decreased mobility, decreased ROM, decreased strength, obesity, and pain.   ACTIVITY LIMITATIONS: carrying, lifting, bending, squatting, bathing, and locomotion level  PARTICIPATION LIMITATIONS: meal prep, cleaning, laundry, shopping, community activity, and yard work  PERSONAL FACTORS: Fitness and 1-2 comorbidities: see problem list  are also affecting patient's functional outcome.   REHAB POTENTIAL: Good  CLINICAL DECISION MAKING: Evolving/moderate complexity  EVALUATION COMPLEXITY: Moderate   GOALS: Goals reviewed with patient? Yes  SHORT TERM GOALS: Target date: 06/11/23  Pt will tolerate full aquatic sessions consistently without increase in pain and with improving function to demonstrate good toleration and effectiveness of intervention.  Baseline: Goal status: INITIAL  2.  Pt will report a 75% decrease in pain with submersion and exercise while engaged Baseline: TBA Goal status: INITIAL  3.  Increase toleration to treadmill at home to 15 minutes and complete 3-4 times weekly Baseline: <10 min Goal status: INITIAL    LONG TERM GOALS: Target date: 07/10/23  Pt to improve on ODI to </= 10% to demonstrate statistically significant Improvement in function. Baseline: 11/45=24% Goal status: INITIAL  2.  Pt will be indep with final HEP's (land and aquatic as appropriate) for continued management of condition Baseline: attempts to walk on treadmill at home Goal status: INITIAL  3.  Pt will improve on 5 X STS test to <or=  13s  to demonstrate improving functional lower extremity strength, transitional movements, and balance Baseline: 17.54 Goal status: INITIAL  4.  Pt will return to full lumbar ROM without pain. Baseline: see chart Goal status: INITIAL  5.  Pt will improve strength of left hip flex to at least 10lb from contralateral  side Baseline:see chart Goal status: INITIAL  6.  Pt will report walking through grocery store in upright position x 30 minutes Baseline:  Goal status: INITIAL  PLAN:  PT FREQUENCY: 2x/week  PT DURATION: 8 weeks  PLANNED INTERVENTIONS: 97164- PT Re-evaluation, 97110-Therapeutic exercises, 97530- Therapeutic activity, 97112- Neuromuscular re-education, 97535- Self Care, 16109- Manual therapy, [phone]- Gait training, [phone]- Orthotic Fit/training, [phone]- Aquatic Therapy, [phone]- Electrical stimulation (unattended), [phone]- Ionotophoresis 4mg /ml Dexamethasone, Patient/Family education, Balance training, Stair training, Taping, Dry Needling, Joint mobilization, DME instructions, Cryotherapy, and Moist heat.  PLAN FOR NEXT SESSION: core strengthening, posture retraining, balance, endurance, body mechanics instruction,   Mayer Camel, PTA 06/02/23 5:53 PM [facility] [address] [place], [place], [address] Phone: [phone]   Fax:  [phone]   Date of referral: 04/11/23 (Pt cancelled 2 evaluation appts) Referring provider: Hurman Horn, MD  Referring diagnosis? M54.50 (ICD-10-CM) - Low back pain  Treatment diagnosis? (if different than referring diagnosis) M54.50 (ICD-10-CM) - Low back pain   What was this (referring dx) caused by? Ongoing Issue  Ashby Dawes of Condition: Chronic (continuous duration > 3 months)   Laterality: Both  Current Functional Measure Score: Other ODI 24%  Objective measurements identify impairments when they are compared to normal values, the uninvolved extremity, and prior level of function.  [x]  Yes  []  No  Objective assessment of functional ability: Moderate functional limitations   Briefly describe symptoms: pain across low back with occasional raditation lle  How did symptoms start: Unknown  Average pain intensity:  Last 24 hours: 3-4/10  Past week: 3-4/10  How often does the pt  experience symptoms? Frequently  How much have the symptoms interfered with usual daily activities? Moderately  How has condition changed since care began at this facility? NA - initial visit  In general, how is the patients overall health? Good   BACK PAIN (STarT Back Screening Tool) Has pain spread down the leg(s) at some time in the last 2 weeks? yes Has there been pain in the shoulder or neck at some time in the last 2 weeks? no Has the pt only walked short distances because of back pain? yes Has patient dressed more slowly because of back pain in the past 2 weeks? yes Does patient think it's not safe for a person with this condition to be physically active? no Does patient have worrying thoughts a lot of the time? no Does patient feel back pain is  terrible and will never get any better? no Has patient stopped enjoying things they usually enjoy? no

## 2023-06-03 ENCOUNTER — Other Ambulatory Visit: Payer: Self-pay

## 2023-06-03 MED ORDER — WEGOVY 0.5 MG/0.5ML ~~LOC~~ SOAJ
0.5000 mg | SUBCUTANEOUS | 0 refills | Status: AC
  Filled 2023-06-03: qty 2, 28d supply, fill #0

## 2023-06-03 MED ORDER — ERGOCALCIFEROL 1.25 MG (50000 UT) PO CAPS
50000.0000 [IU] | ORAL_CAPSULE | ORAL | 2 refills | Status: AC
  Filled 2023-06-03 – 2023-06-09 (×4): qty 4, 28d supply, fill #0
  Filled 2023-07-03: qty 4, 28d supply, fill #1
  Filled 2023-07-31: qty 4, 28d supply, fill #2

## 2023-06-04 ENCOUNTER — Other Ambulatory Visit: Payer: Self-pay

## 2023-06-09 ENCOUNTER — Ambulatory Visit (HOSPITAL_BASED_OUTPATIENT_CLINIC_OR_DEPARTMENT_OTHER): Admitting: Physical Therapy

## 2023-06-09 ENCOUNTER — Other Ambulatory Visit: Payer: Self-pay

## 2023-06-09 ENCOUNTER — Encounter (HOSPITAL_BASED_OUTPATIENT_CLINIC_OR_DEPARTMENT_OTHER): Payer: Self-pay | Admitting: Physical Therapy

## 2023-06-09 DIAGNOSIS — M5459 Other low back pain: Secondary | ICD-10-CM

## 2023-06-09 DIAGNOSIS — M6281 Muscle weakness (generalized): Secondary | ICD-10-CM

## 2023-06-09 DIAGNOSIS — R293 Abnormal posture: Secondary | ICD-10-CM

## 2023-06-09 NOTE — Therapy (Signed)
 OUTPATIENT PHYSICAL THERAPY THORACOLUMBAR TREATMENT   Patient Name: Donna Peterson MRN: [id number] DOB:05-22-1960, 63 y.o., female Today's Date: 06/09/2023  END OF SESSION:  PT End of Session - 06/09/23 1706     Visit Number 3    Number of Visits 16    Date for PT Re-Evaluation 07/10/23    Authorization Type [employer]    PT Start Time 1700    PT Stop Time 1739    PT Time Calculation (min) 39 min    Activity Tolerance Patient tolerated treatment well    Behavior During Therapy WFL for tasks assessed/performed             Past Medical History:  Diagnosis Date   Anemia    none recent   Anxiety    Asthma    mild, no inhaler use   Hypertension    Past Surgical History:  Procedure Laterality Date   cesarian     x 1   COLONOSCOPY WITH PROPOFOL N/A 10/21/2013   Procedure: COLONOSCOPY WITH PROPOFOL;  Surgeon: Petra Kuba, MD;  Location: [facility];  Service: Endoscopy;  Laterality: N/A;   COLONOSCOPY WITH PROPOFOL N/A 02/15/2016   Procedure: COLONOSCOPY WITH PROPOFOL;  Surgeon: Vida Rigger, MD;  Location: [facility];  Service: Endoscopy;  Laterality: N/A;   HYSTEROSCOPY     for fibroids   There are no active problems to display for this patient.   PCP: Johny Blamer MD  REFERRING PROVIDER: Hurman Horn, MD   REFERRING DIAG: M54.50 (ICD-10-CM) - Low back pain   Rationale for Evaluation and Treatment: Rehabilitation  THERAPY DIAG:  Other low back pain  Abnormal posture  Muscle weakness (generalized)  ONSET DATE: 2 years  SUBJECTIVE:                                                                                                                                                                                           SUBJECTIVE STATEMENT: Pt reports she good after last session and went shopping at [employer].  Was sore that night in her LEs. She reports she has been getting up from chair during day at least 1x/hour.   From initial evaluation:  LBP x 2 years,  covid a few years ago worked at home stopped exercising gained weight.  Really has bothered me since.  Has progressively gotten worse.  Have tried different meds with any improvement.  I can't tolerate the gym to exercise.  I have started with healty weight management to help lose weight.   PERTINENT HISTORY:  Dr Freida Busman- Patient presents with persistent low back pain, unimproved by previous treatments. Celebrex and baclofen  were ineffective, with baclofen causing drowsiness. X-ray shows multiple levels of arthritis. NSAIDs are intolerable due to kidney concerns. PT is scheduled, but pain is limiting participation.   PAIN:  Are you having pain? Yes: NPRS scale: 6/10 Pain location: LB with radiation to Rt thigh Pain description: ache, radiating  Aggravating factors: standing 5-10 minutes, walking 15 minutes Relieving factors: sitting resting, lying down, leaning on cart, getting OOB or donning left pants  PRECAUTIONS: None  RED FLAGS: None   WEIGHT BEARING RESTRICTIONS: No  FALLS:  Has patient fallen in last 6 months? 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  NEXT MD VISIT: end of April  OBJECTIVE:  Note: Objective measures were completed at Evaluation unless otherwise noted.  DIAGNOSTIC FINDINGS:  DG Lumbar spine IMPRESSION: 1. Mild multilevel lumbar spine DDD, worse at L2-L3 and L3-L4. 2. Aortic Atherosclerosis (ICD10-I70.0).  PATIENT SURVEYS:  Modified Oswestry 11/45=24%   COGNITION: Overall cognitive status: Within functional limits for tasks assessed     SENSATION: WFL  MUSCLE LENGTH: Hamstrings: Full knee ext R/L/tight   POSTURE: rounded shoulders and forward head  PALPATION: No TTP body habitus inhibits  LUMBAR ROM:    AROM eval  Flexion Full P!  Extension 50% limited P!  Right lateral flexion 75% limited P!  Left lateral flexion 75% P!  Right rotation   Left rotation    (Blank rows = not tested)  LOWER EXTREMITY ROM:     wfl  LOWER EXTREMITY MMT:    MMT Right eval Left eval  Hip flexion 50.4 22.0  Hip extension    Hip abduction 27.6 29.3  Hip adduction    Hip internal rotation    Hip external rotation    Knee flexion    Knee extension 35.8 42.8  Ankle dorsiflexion    Ankle plantarflexion    Ankle inversion    Ankle eversion     (Blank rows = not tested)  LUMBAR SPECIAL TESTS:   Trendelenburg sign neg: Negative slump test neg L  FUNCTIONAL TESTS:  5 times sit to stand: 17.54 hurt but felt good Timed up and go (TUG): 12.07  GAIT: Distance walked: 400 ft Assistive device utilized: None Level of assistance: Complete Independence Comments: normal pattern initially.  As pt tires pt falls into forward flexed posture, minimal arm swing  TREATMENT  [facility] Adult PT Treatment:                                                DATE: 06/09/23 Pt seen for aquatic therapy today.  Treatment took place in water 3.5-4.75 ft in depth at the [facility] pool. Temp of water was 91.  Pt entered/exited the pool via stairs independently with bilat rail.  - UE on barbell:  walking forward across pool x 4 laps; backward 1 lap - UE on barbell: side stepping x 3 laps  - UE on wall: relaxed squats for relief in back x 8; heel raises x 10; hip add/abd x 10 ; hip flexion /extension x 10 ; single leg clams x 10 each LE  - marching forward with alternating row with rainbow hand floats - side stepping  with arm addct/ abdct  - farmer carry with bilat rainbow hand floats at side, marching backwards forward -> single yellow hand float at side marching 1/2 width forward/ backward  - relaxed squat at wall - TrA set with 1/2 hollow noodle pull down to thighs x 4 (challenge)   Pt requires the buoyancy and  hydrostatic pressure of water for support, and to offload joints by unweighting joint load by at least 50 % in navel deep water and by at least 75-80% in chest to neck deep water.  Viscosity of the water is needed for resistance of strengthening. Water current perturbations provides challenge to standing balance requiring increased core activation.                                                                                                                              PATIENT EDUCATION:  Education details:intro to aquatic therapy   Person educated: Patient Education method: Explanation Education comprehension: verbalized understanding  HOME EXERCISE PROGRAM: TBA  ASSESSMENT:  CLINICAL IMPRESSION: Pt demonstrates good improvement in confidence in aquatic setting; able to walk across width of pool at 4 ft without support.  Pt reported reduction of back pain during session. Able to progress exercises today without loss of balance and minimal difficulty. Faustine remains a good candidate for aquatic therapy and will benefit from the properties of water to progress towards land based goals.  Goals are ongoing.       Initial impression: Patient is a 63 y.o. f who was seen today for physical therapy evaluation and treatment for LBP. She presents without ad.  Reports pain mostly lumbar spine with some radiation into bilat hips with radiation occasionally down mid calf presenting as a sharp pain nothing that lingers.  Testing demonstrates pt with poor posture, forward leaning.  Lumbar ROM limited mostly in side bending.  She does have pain with returning to vertical from full flexion.  She has core and left hip weakness.  Diagnostics state mild lumber DDD.  I do suspect her pain is mostly due to muscle weakness and poor posture which has slowly increased over the past few months. She will benefit from skilled PT intervention to improve deficits and return her to PLOF.  Will begin in aquatics then  transition to land as pain is better managed and strength in core and left hip improves.  Will also focus on posture and balance.  OBJECTIVE IMPAIRMENTS: decreased activity tolerance, decreased balance, decreased endurance, decreased mobility, decreased ROM, decreased strength, obesity, and pain.   ACTIVITY LIMITATIONS: carrying, lifting, bending, squatting, bathing, and locomotion level  PARTICIPATION LIMITATIONS: meal prep, cleaning, laundry, shopping, community activity, and yard work  PERSONAL FACTORS: Fitness and 1-2 comorbidities: see problem list  are also affecting patient's functional outcome.   REHAB POTENTIAL: Good  CLINICAL DECISION MAKING: Evolving/moderate complexity  EVALUATION COMPLEXITY: Moderate   GOALS: Goals reviewed with patient? Yes  SHORT TERM GOALS: Target date: 06/11/23  Pt  will tolerate full aquatic sessions consistently without increase in pain and with improving function to demonstrate good toleration and effectiveness of intervention.  Baseline: Goal status: INITIAL  2.  Pt will report a 75% decrease in pain with submersion and exercise while engaged Baseline: TBA Goal status: INITIAL  3.  Increase toleration to treadmill at home to 15 minutes and complete 3-4 times weekly Baseline: <10 min Goal status: INITIAL    LONG TERM GOALS: Target date: 07/10/23  Pt to improve on ODI to </= 10% to demonstrate statistically significant Improvement in function. Baseline: 11/45=24% Goal status: INITIAL  2.  Pt will be indep with final HEP's (land and aquatic as appropriate) for continued management of condition Baseline: attempts to walk on treadmill at home Goal status: INITIAL  3.  Pt will improve on 5 X STS test to <or=  13s  to demonstrate improving functional lower extremity strength, transitional movements, and balance Baseline: 17.54 Goal status: INITIAL  4.  Pt will return to full lumbar ROM without pain. Baseline: see chart Goal status:  INITIAL  5.  Pt will improve strength of left hip flex to at least 10lb from contralateral side Baseline:see chart Goal status: INITIAL  6.  Pt will report walking through grocery store in upright position x 30 minutes Baseline:  Goal status: INITIAL  PLAN:  PT FREQUENCY: 2x/week  PT DURATION: 8 weeks  PLANNED INTERVENTIONS: 97164- PT Re-evaluation, 97110-Therapeutic exercises, 97530- Therapeutic activity, 97112- Neuromuscular re-education, 97535- Self Care, 16109- Manual therapy, [phone]- Gait training, [phone]- Orthotic Fit/training, [phone]- Aquatic Therapy, [phone]- Electrical stimulation (unattended), [phone]- Ionotophoresis 4mg /ml Dexamethasone, Patient/Family education, Balance training, Stair training, Taping, Dry Needling, Joint mobilization, DME instructions, Cryotherapy, and Moist heat.  PLAN FOR NEXT SESSION: core strengthening, posture retraining, balance, endurance, body mechanics instruction,   Mayer Camel, PTA 06/09/23 5:48 PM [facility] [address] [place], [place], [address] Phone: [phone]   Fax:  [phone]    Date of referral: 04/11/23 (Pt cancelled 2 evaluation appts) Referring provider: Hurman Horn, MD  Referring diagnosis? M54.50 (ICD-10-CM) - Low back pain  Treatment diagnosis? (if different than referring diagnosis) M54.50 (ICD-10-CM) - Low back pain   What was this (referring dx) caused by? Ongoing Issue  Ashby Dawes of Condition: Chronic (continuous duration > 3 months)   Laterality: Both  Current Functional Measure Score: Other ODI 24%  Objective measurements identify impairments when they are compared to normal values, the uninvolved extremity, and prior level of function.  [x]  Yes  []  No  Objective assessment of functional ability: Moderate functional limitations   Briefly describe symptoms: pain across low back with occasional raditation lle  How did symptoms start:  Unknown  Average pain intensity:  Last 24 hours: 3-4/10  Past week: 3-4/10  How often does the pt experience symptoms? Frequently  How much have the symptoms interfered with usual daily activities? Moderately  How has condition changed since care began at this facility? NA - initial visit  In general, how is the patients overall health? Good   BACK PAIN (STarT Back Screening Tool) Has pain spread down the leg(s) at some time in the last 2 weeks? yes Has there been pain in the shoulder or neck at some time in the last 2 weeks? no Has the pt only walked short distances because of back pain? yes Has patient dressed more slowly because of back pain in the past 2 weeks? yes Does patient think it's not safe for a person with  this condition to be physically active? no Does patient have worrying thoughts a lot of the time? no Does patient feel back pain is terrible and will never get any better? no Has patient stopped enjoying things they usually enjoy? no

## 2023-06-10 ENCOUNTER — Other Ambulatory Visit: Payer: Self-pay

## 2023-06-12 ENCOUNTER — Encounter (HOSPITAL_BASED_OUTPATIENT_CLINIC_OR_DEPARTMENT_OTHER): Payer: Self-pay | Admitting: Physical Therapy

## 2023-06-12 ENCOUNTER — Ambulatory Visit (HOSPITAL_BASED_OUTPATIENT_CLINIC_OR_DEPARTMENT_OTHER): Admitting: Physical Therapy

## 2023-06-12 DIAGNOSIS — R293 Abnormal posture: Secondary | ICD-10-CM

## 2023-06-12 DIAGNOSIS — M6281 Muscle weakness (generalized): Secondary | ICD-10-CM

## 2023-06-12 DIAGNOSIS — M5459 Other low back pain: Secondary | ICD-10-CM

## 2023-06-12 NOTE — Therapy (Signed)
 OUTPATIENT PHYSICAL THERAPY THORACOLUMBAR TREATMENT   Patient Name: Donna Peterson MRN: [id number] DOB:1960-06-28, 63 y.o., female Today's Date: 06/12/2023  END OF SESSION:  PT End of Session - 06/12/23 1520     Visit Number 4    Number of Visits 16    Date for PT Re-Evaluation 07/10/23    Authorization Type [employer]    PT Start Time 1313    PT Stop Time 1351    PT Time Calculation (min) 38 min    Activity Tolerance Patient tolerated treatment well    Behavior During Therapy WFL for tasks assessed/performed             Past Medical History:  Diagnosis Date   Anemia    none recent   Anxiety    Asthma    mild, no inhaler use   Hypertension    Past Surgical History:  Procedure Laterality Date   cesarian     x 1   COLONOSCOPY WITH PROPOFOL N/A 10/21/2013   Procedure: COLONOSCOPY WITH PROPOFOL;  Surgeon: Petra Kuba, MD;  Location: [facility];  Service: Endoscopy;  Laterality: N/A;   COLONOSCOPY WITH PROPOFOL N/A 02/15/2016   Procedure: COLONOSCOPY WITH PROPOFOL;  Surgeon: Vida Rigger, MD;  Location: [facility];  Service: Endoscopy;  Laterality: N/A;   HYSTEROSCOPY     for fibroids   There are no active problems to display for this patient.   PCP: Johny Blamer MD  REFERRING PROVIDER: Hurman Horn, MD   REFERRING DIAG: M54.50 (ICD-10-CM) - Low back pain   Rationale for Evaluation and Treatment: Rehabilitation  THERAPY DIAG:  Other low back pain  Abnormal posture  Muscle weakness (generalized)  ONSET DATE: 2 years  SUBJECTIVE:                                                                                                                                                                                           SUBJECTIVE STATEMENT: Pt reports she has been getting up once an hour from chair while working; this has helped but back remains stiff when she stands up. She is now doing a few exercises at her desk (ie: hip abdct, side bends)   POOL ACCESS: pt  plans to check out the [facility].   From initial evaluation:  LBP x 2 years, covid a few years ago worked at home stopped exercising gained weight.  Really has bothered me since.  Has progressively gotten worse.  Have tried different meds with any improvement.  I can't tolerate the gym to exercise.  I have started with healty weight management to help lose weight.   PERTINENT HISTORY:  Dr Freida Busman- Patient presents with persistent low back pain, unimproved by previous treatments. Celebrex and baclofen were ineffective, with baclofen causing drowsiness. X-ray shows multiple levels of arthritis. NSAIDs are intolerable due to kidney concerns. PT is scheduled, but pain is limiting participation.   PAIN:  Are you having pain? no: NPRS scale: 0/10   (was 6/10 upon early arrival, but pain eliminated with seated rest)  Pain location: LB  Pain description: ache, radiating  Aggravating factors: standing 5-10 minutes, walking 15 minutes Relieving factors: sitting resting, lying down, leaning on cart, getting OOB or donning left pants  PRECAUTIONS: None  RED FLAGS: None   WEIGHT BEARING RESTRICTIONS: No  FALLS:  Has patient fallen in last 6 months? 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  NEXT MD VISIT: end of April  OBJECTIVE:  Note: Objective measures were completed at Evaluation unless otherwise noted.  DIAGNOSTIC FINDINGS:  DG Lumbar spine IMPRESSION: 1. Mild multilevel lumbar spine DDD, worse at L2-L3 and L3-L4. 2. Aortic Atherosclerosis (ICD10-I70.0).  PATIENT SURVEYS:  Modified Oswestry 11/45=24%   COGNITION: Overall cognitive status: Within functional limits for tasks assessed     SENSATION: WFL  MUSCLE LENGTH: Hamstrings: Full knee  ext R/L/tight   POSTURE: rounded shoulders and forward head  PALPATION: No TTP body habitus inhibits  LUMBAR ROM:   AROM eval  Flexion Full P!  Extension 50% limited P!  Right lateral flexion 75% limited P!  Left lateral flexion 75% P!  Right rotation   Left rotation    (Blank rows = not tested)  LOWER EXTREMITY ROM:     wfl  LOWER EXTREMITY MMT:    MMT Right eval Left eval  Hip flexion 50.4 22.0  Hip extension    Hip abduction 27.6 29.3  Hip adduction    Hip internal rotation    Hip external rotation    Knee flexion    Knee extension 35.8 42.8  Ankle dorsiflexion    Ankle plantarflexion    Ankle inversion    Ankle eversion     (Blank rows = not tested)  LUMBAR SPECIAL TESTS:   Trendelenburg sign neg: Negative slump test neg L  FUNCTIONAL TESTS:  5 times sit to stand: 17.54 hurt but felt good Timed up and go (TUG): 12.07  GAIT: Distance walked: 400 ft Assistive device utilized: None Level of assistance: Complete Independence Comments: normal pattern initially.  As pt tires pt falls into forward flexed posture, minimal arm swing  TREATMENT  [facility] Adult PT Treatment:                                                DATE: 06/12/23 Pt seen for aquatic therapy today.  Treatment took place in water 3.5-4.75 ft in depth at the [facility] pool. Temp of water was 91.  Pt entered/exited the pool via stairs independently with bilat rail.  - UE on yellow hand floats:  walking forward across pool x 3 laps; backward 2.5 laps - UE on yellow hand floats: side stepping x 2 laps -> - side stepping with arm addct/ abdct  - UE on wall: relaxed squats for relief in back  x 8; heel/toe raises x 10; hip add/abd x 10 ; single leg clams x 10 each LE  - farmer carry with single yellow hand float at side walking forward/ backward  - marching forward with alternating row with yellow hand floats - relaxed squat at wall - TrA set with 1/2 hollow noodle pull down to thighs x  10 (much improved) - tricep push downs with TrA set x 5 - wide stance with kick board push/pull x 5; in staggered stance x 5 each LE - walking forward with breast stroke arms    Pt requires the buoyancy and hydrostatic pressure of water for support, and to offload joints by unweighting joint load by at least 50 % in navel deep water and by at least 75-80% in chest to neck deep water.  Viscosity of the water is needed for resistance of strengthening. Water current perturbations provides challenge to standing balance requiring increased core activation.                                                                                                                              PATIENT EDUCATION:  Education details:intro to aquatic therapy   Person educated: Patient Education method: Explanation Education comprehension: verbalized understanding  HOME EXERCISE PROGRAM: TBA  ASSESSMENT:  CLINICAL IMPRESSION: Pt demonstrates continued improvement in confidence in aquatic setting; she was able to walk across pool without floatation support at end of session.  Pt reported some tightness in back with forward gait, but it was eliminated with relaxed squat.  Able to progress exercises today without loss of balance and minimal difficulty. Minor cues given for gait pattern (walks occasionally with wide BOS).  Elen remains a good candidate for aquatic therapy and will benefit from the properties of water to progress towards land based goals.  Goals are ongoing.       Initial impression: Patient is a 63 y.o. f who was seen today for physical therapy evaluation and treatment for LBP. She presents without ad.  Reports pain mostly lumbar spine with some radiation into bilat hips with radiation occasionally down mid calf presenting as a sharp pain nothing that lingers.  Testing demonstrates pt with poor posture, forward leaning.  Lumbar ROM limited mostly in side bending.  She does have pain with  returning to vertical from full flexion.  She has core and left hip weakness.  Diagnostics state mild lumber DDD.  I do suspect her pain is mostly due to muscle weakness and poor posture which has slowly increased over the past few months. She will benefit from skilled PT intervention to improve deficits and return her to PLOF.  Will begin in aquatics then transition to land as pain is better managed and strength in core and left hip improves.  Will also focus on posture and balance.  OBJECTIVE IMPAIRMENTS: decreased activity tolerance, decreased balance, decreased endurance, decreased mobility, decreased ROM, decreased strength, obesity, and pain.   ACTIVITY LIMITATIONS: carrying,  lifting, bending, squatting, bathing, and locomotion level  PARTICIPATION LIMITATIONS: meal prep, cleaning, laundry, shopping, community activity, and yard work  PERSONAL FACTORS: Fitness and 1-2 comorbidities: see problem list  are also affecting patient's functional outcome.   REHAB POTENTIAL: Good  CLINICAL DECISION MAKING: Evolving/moderate complexity  EVALUATION COMPLEXITY: Moderate   GOALS: Goals reviewed with patient? Yes  SHORT TERM GOALS: Target date: 06/11/23  Pt will tolerate full aquatic sessions consistently without increase in pain and with improving function to demonstrate good toleration and effectiveness of intervention.  Baseline: Goal status: INITIAL  2.  Pt will report a 75% decrease in pain with submersion and exercise while engaged Baseline: TBA Goal status: INITIAL  3.  Increase toleration to treadmill at home to 15 minutes and complete 3-4 times weekly Baseline: <10 min Goal status: INITIAL    LONG TERM GOALS: Target date: 07/10/23  Pt to improve on ODI to </= 10% to demonstrate statistically significant Improvement in function. Baseline: 11/45=24% Goal status: INITIAL  2.  Pt will be indep with final HEP's (land and aquatic as appropriate) for continued management of  condition Baseline: attempts to walk on treadmill at home Goal status: INITIAL  3.  Pt will improve on 5 X STS test to <or=  13s  to demonstrate improving functional lower extremity strength, transitional movements, and balance Baseline: 17.54 Goal status: INITIAL  4.  Pt will return to full lumbar ROM without pain. Baseline: see chart Goal status: INITIAL  5.  Pt will improve strength of left hip flex to at least 10lb from contralateral side Baseline:see chart Goal status: INITIAL  6.  Pt will report walking through grocery store in upright position x 30 minutes Baseline:  Goal status: INITIAL  PLAN:  PT FREQUENCY: 2x/week  PT DURATION: 8 weeks  PLANNED INTERVENTIONS: 97164- PT Re-evaluation, 97110-Therapeutic exercises, 97530- Therapeutic activity, 97112- Neuromuscular re-education, 97535- Self Care, 54098- Manual therapy, [phone]- Gait training, [phone]- Orthotic Fit/training, [phone]- Aquatic Therapy, [phone]- Electrical stimulation (unattended), [phone]- Ionotophoresis 4mg /ml Dexamethasone, Patient/Family education, Balance training, Stair training, Taping, Dry Needling, Joint mobilization, DME instructions, Cryotherapy, and Moist heat.  PLAN FOR NEXT SESSION: core strengthening, posture retraining, balance, endurance, body mechanics instruction,   Mayer Camel, PTA 06/12/23 3:21 PM [facility] [address] [place], [place], [address] Phone: [phone]   Fax:  [phone]    Date of referral: 04/11/23 (Pt cancelled 2 evaluation appts) Referring provider: Hurman Horn, MD  Referring diagnosis? M54.50 (ICD-10-CM) - Low back pain  Treatment diagnosis? (if different than referring diagnosis) M54.50 (ICD-10-CM) - Low back pain   What was this (referring dx) caused by? Ongoing Issue  Ashby Dawes of Condition: Chronic (continuous duration > 3 months)   Laterality: Both  Current Functional Measure Score: Other ODI  24%  Objective measurements identify impairments when they are compared to normal values, the uninvolved extremity, and prior level of function.  [x]  Yes  []  No  Objective assessment of functional ability: Moderate functional limitations   Briefly describe symptoms: pain across low back with occasional raditation lle  How did symptoms start: Unknown  Average pain intensity:  Last 24 hours: 3-4/10  Past week: 3-4/10  How often does the pt experience symptoms? Frequently  How much have the symptoms interfered with usual daily activities? Moderately  How has condition changed since care began at this facility? NA - initial visit  In general, how is the patients overall health? Good   BACK PAIN (STarT Back Screening Tool) Has  pain spread down the leg(s) at some time in the last 2 weeks? yes Has there been pain in the shoulder or neck at some time in the last 2 weeks? no Has the pt only walked short distances because of back pain? yes Has patient dressed more slowly because of back pain in the past 2 weeks? yes Does patient think it's not safe for a person with this condition to be physically active? no Does patient have worrying thoughts a lot of the time? no Does patient feel back pain is terrible and will never get any better? no Has patient stopped enjoying things they usually enjoy? no

## 2023-06-16 ENCOUNTER — Encounter (HOSPITAL_BASED_OUTPATIENT_CLINIC_OR_DEPARTMENT_OTHER): Payer: Self-pay | Admitting: Physical Therapy

## 2023-06-16 ENCOUNTER — Ambulatory Visit (HOSPITAL_BASED_OUTPATIENT_CLINIC_OR_DEPARTMENT_OTHER): Admitting: Physical Therapy

## 2023-06-16 DIAGNOSIS — M5459 Other low back pain: Secondary | ICD-10-CM

## 2023-06-16 DIAGNOSIS — R293 Abnormal posture: Secondary | ICD-10-CM

## 2023-06-16 DIAGNOSIS — M6281 Muscle weakness (generalized): Secondary | ICD-10-CM

## 2023-06-16 NOTE — Therapy (Signed)
 OUTPATIENT PHYSICAL THERAPY THORACOLUMBAR TREATMENT   Patient Name: Donna Peterson MRN: [id number] DOB:1960-07-18, 63 y.o., female Today's Date: 06/16/2023  END OF SESSION:  PT End of Session - 06/16/23 1705     Visit Number 5    Number of Visits 16    Date for PT Re-Evaluation 07/10/23    Authorization Type [employer]    PT Start Time 1700    PT Stop Time 1740    PT Time Calculation (min) 40 min    Activity Tolerance Patient tolerated treatment well    Behavior During Therapy WFL for tasks assessed/performed              Past Medical History:  Diagnosis Date   Anemia    none recent   Anxiety    Asthma    mild, no inhaler use   Hypertension    Past Surgical History:  Procedure Laterality Date   cesarian     x 1   COLONOSCOPY WITH PROPOFOL N/A 10/21/2013   Procedure: COLONOSCOPY WITH PROPOFOL;  Surgeon: Mathew Solomon, MD;  Location: [facility];  Service: Endoscopy;  Laterality: N/A;   COLONOSCOPY WITH PROPOFOL N/A 02/15/2016   Procedure: COLONOSCOPY WITH PROPOFOL;  Surgeon: Ozell Blunt, MD;  Location: [facility];  Service: Endoscopy;  Laterality: N/A;   HYSTEROSCOPY     for fibroids   There are no active problems to display for this patient.   PCP: Elyn Han MD  REFERRING PROVIDER: Rance Burrows, MD   REFERRING DIAG: M54.50 (ICD-10-CM) - Low back pain   Rationale for Evaluation and Treatment: Rehabilitation  THERAPY DIAG:  Other low back pain  Abnormal posture  Muscle weakness (generalized)  ONSET DATE: 2 years  SUBJECTIVE:                                                                                                                                                                                           SUBJECTIVE STATEMENT: Pt reports good reduction of pain with aquatic intervention overall.  POOL ACCESS: pt plans to check out the [facility].   From initial evaluation:  LBP x 2 years, covid a few years ago worked at home stopped exercising  gained weight.  Really has bothered me since.  Has progressively gotten worse.  Have tried different meds with any improvement.  I can't tolerate the gym to exercise.  I have started with healty weight management to help lose weight.   PERTINENT HISTORY:  Dr Leighton Punches- Patient presents with persistent low back pain, unimproved by previous treatments. Celebrex and baclofen were ineffective, with baclofen causing drowsiness. X-ray shows multiple levels of arthritis. NSAIDs  are intolerable due to kidney concerns. PT is scheduled, but pain is limiting participation.   PAIN:  Are you having pain? no: NPRS scale: 0/10   (was 6/10 upon early arrival, but pain eliminated with seated rest)  Pain location: LB  Pain description: ache, radiating  Aggravating factors: standing 5-10 minutes, walking 15 minutes Relieving factors: sitting resting, lying down, leaning on cart, getting OOB or donning left pants  PRECAUTIONS: None  RED FLAGS: None   WEIGHT BEARING RESTRICTIONS: No  FALLS:  Has patient fallen in last 6 months? 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  NEXT MD VISIT: end of April  OBJECTIVE:  Note: Objective measures were completed at Evaluation unless otherwise noted.  DIAGNOSTIC FINDINGS:  DG Lumbar spine IMPRESSION: 1. Mild multilevel lumbar spine DDD, worse at L2-L3 and L3-L4. 2. Aortic Atherosclerosis (ICD10-I70.0).  PATIENT SURVEYS:  Modified Oswestry 11/45=24%   COGNITION: Overall cognitive status: Within functional limits for tasks assessed     SENSATION: WFL  MUSCLE LENGTH: Hamstrings: Full knee ext R/L/tight   POSTURE: rounded shoulders and forward head  PALPATION: No TTP body habitus inhibits  LUMBAR ROM:   AROM eval   Flexion Full P!  Extension 50% limited P!  Right lateral flexion 75% limited P!  Left lateral flexion 75% P!  Right rotation   Left rotation    (Blank rows = not tested)  LOWER EXTREMITY ROM:     wfl  LOWER EXTREMITY MMT:    MMT Right eval Left eval  Hip flexion 50.4 22.0  Hip extension    Hip abduction 27.6 29.3  Hip adduction    Hip internal rotation    Hip external rotation    Knee flexion    Knee extension 35.8 42.8  Ankle dorsiflexion    Ankle plantarflexion    Ankle inversion    Ankle eversion     (Blank rows = not tested)  LUMBAR SPECIAL TESTS:   Trendelenburg sign neg: Negative slump test neg L  FUNCTIONAL TESTS:  5 times sit to stand: 17.54 hurt but felt good Timed up and go (TUG): 12.07  GAIT: Distance walked: 400 ft Assistive device utilized: None Level of assistance: Complete Independence Comments: normal pattern initially.  As pt tires pt falls into forward flexed posture, minimal arm swing  TREATMENT  [facility] Adult PT Treatment:                                                DATE: 06/16/23 Pt seen for aquatic therapy today.  Treatment took place in water 3.5-4.75 ft in depth at the [facility] pool. Temp of water was 91.  Pt entered/exited the pool via stairs independently with bilat rail.  - UE on yellow hand floats:  walking forward across pool x 3 laps; backward 2.5 laps - UE on yellow hand floats: side stepping x 2 laps -> - side stepping with arm addct/ abdct x 2 laps - relaxed squat at wall - UE support yellow hb: relaxed squats x 10; heel/toe raises x 10; hip add/abd x 10 ; high knee marching x10 -wide stance then staggered stance horizontal add/abd - relaxed squat  at wall - TrA set with 1/2 hollow noodle pull down to thighs x 10 wide stance; staggered stances x 5 -L stretch; L stretch with tail wagging (good stretch) - farmer carry with single yellow hand float at side walking forward/ backward  -decompression with noodle wrapped  anteriorly across chest ue support corner wall   Pt requires the buoyancy and hydrostatic pressure of water for support, and to offload joints by unweighting joint load by at least 50 % in navel deep water and by at least 75-80% in chest to neck deep water.  Viscosity of the water is needed for resistance of strengthening. Water current perturbations provides challenge to standing balance requiring increased core activation.                                                                                                                              PATIENT EDUCATION:  Education details:intro to aquatic therapy   Person educated: Patient Education method: Explanation Education comprehension: verbalized understanding  HOME EXERCISE PROGRAM: TBA  ASSESSMENT:  CLINICAL IMPRESSION: Improved strength and balance as demonstrated through decreased ue support with amb and exercise.  Able to progress core engagement with very good toleration.  Able to gain partial decompression position with noodle at corner of pool gaining unloaded le movement and complete pain reduction. Goals ongoing       Initial impression: Patient is a 63 y.o. f who was seen today for physical therapy evaluation and treatment for LBP. She presents without ad.  Reports pain mostly lumbar spine with some radiation into bilat hips with radiation occasionally down mid calf presenting as a sharp pain nothing that lingers.  Testing demonstrates pt with poor posture, forward leaning.  Lumbar ROM limited mostly in side bending.  She does have pain with returning to vertical from full flexion.  She has core and left hip weakness.  Diagnostics state mild lumber DDD.  I do suspect her pain is mostly due to muscle weakness and poor posture which has slowly increased over the past few months. She will benefit from skilled PT intervention to improve deficits and return her to PLOF.  Will begin in aquatics then transition to land as pain is  better managed and strength in core and left hip improves.  Will also focus on posture and balance.  OBJECTIVE IMPAIRMENTS: decreased activity tolerance, decreased balance, decreased endurance, decreased mobility, decreased ROM, decreased strength, obesity, and pain.   ACTIVITY LIMITATIONS: carrying, lifting, bending, squatting, bathing, and locomotion level  PARTICIPATION LIMITATIONS: meal prep, cleaning, laundry, shopping, community activity, and yard work  PERSONAL FACTORS: Fitness and 1-2 comorbidities: see problem list  are also affecting patient's functional outcome.   REHAB POTENTIAL: Good  CLINICAL DECISION MAKING: Evolving/moderate complexity  EVALUATION COMPLEXITY: Moderate   GOALS: Goals reviewed with patient? Yes  SHORT TERM GOALS: Target date: 06/11/23  Pt will tolerate full aquatic sessions consistently without increase in pain and with improving function to demonstrate  good toleration and effectiveness of intervention.  Baseline: Goal status: Met 06/16/23  2.  Pt will report a 75% decrease in pain with submersion and exercise while engaged Baseline: TBA Goal status: Met 06/16/23  3.  Increase toleration to treadmill at home to 15 minutes and complete 3-4 times weekly Baseline: <10 min Goal status: INITIAL    LONG TERM GOALS: Target date: 07/10/23  Pt to improve on ODI to </= 10% to demonstrate statistically significant Improvement in function. Baseline: 11/45=24% Goal status: INITIAL  2.  Pt will be indep with final HEP's (land and aquatic as appropriate) for continued management of condition Baseline: attempts to walk on treadmill at home Goal status: INITIAL  3.  Pt will improve on 5 X STS test to <or=  13s  to demonstrate improving functional lower extremity strength, transitional movements, and balance Baseline: 17.54 Goal status: INITIAL  4.  Pt will return to full lumbar ROM without pain. Baseline: see chart Goal status: INITIAL  5.  Pt will  improve strength of left hip flex to at least 10lb from contralateral side Baseline:see chart Goal status: INITIAL  6.  Pt will report walking through grocery store in upright position x 30 minutes Baseline:  Goal status: INITIAL  PLAN:  PT FREQUENCY: 2x/week  PT DURATION: 8 weeks  PLANNED INTERVENTIONS: 97164- PT Re-evaluation, 97110-Therapeutic exercises, 97530- Therapeutic activity, 97112- Neuromuscular re-education, 97535- Self Care, 84696- Manual therapy, [phone]- Gait training, [phone]- Orthotic Fit/training, [phone]- Aquatic Therapy, [phone]- Electrical stimulation (unattended), [phone]- Ionotophoresis 4mg /ml Dexamethasone, Patient/Family education, Balance training, Stair training, Taping, Dry Needling, Joint mobilization, DME instructions, Cryotherapy, and Moist heat.  PLAN FOR NEXT SESSION: core strengthening, posture retraining, balance, endurance, body mechanics instruction,   Rushie Chestnut) Sesilia Poucher MPT 06/16/23 6:24 PM [facility] [address] [place], [place], [address] Phone: [phone]   Fax:  [phone]     Date of referral: 04/11/23 (Pt cancelled 2 evaluation appts) Referring provider: Hurman Horn, MD  Referring diagnosis? M54.50 (ICD-10-CM) - Low back pain  Treatment diagnosis? (if different than referring diagnosis) M54.50 (ICD-10-CM) - Low back pain   What was this (referring dx) caused by? Ongoing Issue  Ashby Dawes of Condition: Chronic (continuous duration > 3 months)   Laterality: Both  Current Functional Measure Score: Other ODI 24%  Objective measurements identify impairments when they are compared to normal values, the uninvolved extremity, and prior level of function.  [x]  Yes  []  No  Objective assessment of functional ability: Moderate functional limitations   Briefly describe symptoms: pain across low back with occasional raditation lle  How did symptoms start: Unknown  Average pain  intensity:  Last 24 hours: 3-4/10  Past week: 3-4/10  How often does the pt experience symptoms? Frequently  How much have the symptoms interfered with usual daily activities? Moderately  How has condition changed since care began at this facility? NA - initial visit  In general, how is the patients overall health? Good   BACK PAIN (STarT Back Screening Tool) Has pain spread down the leg(s) at some time in the last 2 weeks? yes Has there been pain in the shoulder or neck at some time in the last 2 weeks? no Has the pt only walked short distances because of back pain? yes Has patient dressed more slowly because of back pain in the past 2 weeks? yes Does patient think it's not safe for a person with this condition to be physically active? no Does patient have worrying thoughts  a lot of the time? no Does patient feel back pain is terrible and will never get any better? no Has patient stopped enjoying things they usually enjoy? no

## 2023-06-18 ENCOUNTER — Ambulatory Visit (HOSPITAL_BASED_OUTPATIENT_CLINIC_OR_DEPARTMENT_OTHER): Admitting: Physical Therapy

## 2023-06-23 ENCOUNTER — Ambulatory Visit (HOSPITAL_BASED_OUTPATIENT_CLINIC_OR_DEPARTMENT_OTHER): Admitting: Physical Therapy

## 2023-06-23 ENCOUNTER — Encounter (HOSPITAL_BASED_OUTPATIENT_CLINIC_OR_DEPARTMENT_OTHER): Payer: Self-pay | Admitting: Physical Therapy

## 2023-06-23 DIAGNOSIS — M5459 Other low back pain: Secondary | ICD-10-CM

## 2023-06-23 DIAGNOSIS — M6281 Muscle weakness (generalized): Secondary | ICD-10-CM

## 2023-06-23 DIAGNOSIS — R293 Abnormal posture: Secondary | ICD-10-CM

## 2023-06-23 NOTE — Therapy (Signed)
 OUTPATIENT PHYSICAL THERAPY THORACOLUMBAR TREATMENT   Patient Name: Donna Peterson MRN: [id number] DOB:07/12/1960, 63 y.o., female Today's Date: 06/23/2023  END OF SESSION:  PT End of Session - 06/23/23 1705     Visit Number 6    Number of Visits 16    Date for PT Re-Evaluation 07/10/23    Authorization Type [employer]    PT Start Time 1700    PT Stop Time 1739    PT Time Calculation (min) 39 min    Behavior During Therapy WFL for tasks assessed/performed              Past Medical History:  Diagnosis Date   Anemia    none recent   Anxiety    Asthma    mild, no inhaler use   Hypertension    Past Surgical History:  Procedure Laterality Date   cesarian     x 1   COLONOSCOPY WITH PROPOFOL  N/A 10/21/2013   Procedure: COLONOSCOPY WITH PROPOFOL ;  Surgeon: Mathew Solomon, MD;  Location: [facility];  Service: Endoscopy;  Laterality: N/A;   COLONOSCOPY WITH PROPOFOL  N/A 02/15/2016   Procedure: COLONOSCOPY WITH PROPOFOL ;  Surgeon: Ozell Blunt, MD;  Location: [facility];  Service: Endoscopy;  Laterality: N/A;   HYSTEROSCOPY     for fibroids   There are no active problems to display for this patient.   PCP: Elyn Han MD  REFERRING PROVIDER: Rance Burrows, MD   REFERRING DIAG: M54.50 (ICD-10-CM) - Low back pain   Rationale for Evaluation and Treatment: Rehabilitation  THERAPY DIAG:  Other low back pain  Abnormal posture  Muscle weakness (generalized)  ONSET DATE: 2 years  SUBJECTIVE:                                                                                                                                                                                           SUBJECTIVE STATEMENT: Pt reports she is now using lumbar pillow in work chair and this is helping; needs to get one for car.  She shopped in grocery store this morning, and as long as she took her time, she didn't have any back pain.   POOL ACCESS: pt plans to check out the [facility].   From  initial evaluation:  LBP x 2 years, covid a few years ago worked at home stopped exercising gained weight.  Really has bothered me since.  Has progressively gotten worse.  Have tried different meds with any improvement.  I can't tolerate the gym to exercise.  I have started with healty weight management to help lose weight.   PERTINENT HISTORY:  Dr Leighton Punches- Patient presents  with persistent low back pain, unimproved by previous treatments. Celebrex and baclofen were ineffective, with baclofen causing drowsiness. X-ray shows multiple levels of arthritis. NSAIDs are intolerable due to kidney concerns. PT is scheduled, but pain is limiting participation.   PAIN:  Are you having pain? no: NPRS scale: 0/10   Pain location: LB  Pain description:  Aggravating factors: standing 5-10 minutes, walking 15 minutes Relieving factors: sitting resting, lying down, leaning on cart, getting OOB or donning left pants  PRECAUTIONS: None  RED FLAGS: None   WEIGHT BEARING RESTRICTIONS: No  FALLS:  Has patient fallen in last 6 months? 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  NEXT MD VISIT: end of April  OBJECTIVE:  Note: Objective measures were completed at Evaluation unless otherwise noted.  DIAGNOSTIC FINDINGS:  DG Lumbar spine IMPRESSION: 1. Mild multilevel lumbar spine DDD, worse at L2-L3 and L3-L4. 2. Aortic Atherosclerosis (ICD10-I70.0).  PATIENT SURVEYS:  Modified Oswestry 11/45=24%   COGNITION: Overall cognitive status: Within functional limits for tasks assessed     SENSATION: WFL  MUSCLE LENGTH: Hamstrings: Full knee ext R/L/tight   POSTURE: rounded shoulders and forward head  PALPATION: No TTP body habitus inhibits  LUMBAR ROM:   AROM  eval  Flexion Full P!  Extension 50% limited P!  Right lateral flexion 75% limited P!  Left lateral flexion 75% P!  Right rotation   Left rotation    (Blank rows = not tested)  LOWER EXTREMITY ROM:     wfl  LOWER EXTREMITY MMT:    MMT Right eval Left eval  Hip flexion 50.4 22.0  Hip extension    Hip abduction 27.6 29.3  Hip adduction    Hip internal rotation    Hip external rotation    Knee flexion    Knee extension 35.8 42.8  Ankle dorsiflexion    Ankle plantarflexion    Ankle inversion    Ankle eversion     (Blank rows = not tested)  LUMBAR SPECIAL TESTS:   Trendelenburg sign neg: Negative slump test neg L  FUNCTIONAL TESTS:  5 times sit to stand: 17.54 hurt but felt good Timed up and go (TUG): 12.07  06/23/23:  5x STS: 14.96s  GAIT: Distance walked: 400 ft Assistive device utilized: None Level of assistance: Complete Independence Comments: normal pattern initially.  As pt tires pt falls into forward flexed posture, minimal arm swing  TREATMENT  [facility] Adult PT Treatment:                                                DATE: 06/23/23 Pt seen for aquatic therapy today.  Treatment took place in water 3.5-4.75 ft in depth at the [facility] pool. Temp of water was 91.  Pt entered/exited the pool via stairs independently with bilat rail.  - unsupported:   walking forward across pool x 3 laps; backward 2.5 laps - Unsupported:  side stepping x 2 laps -> - side stepping with arm addct/ abdct x 2 laps with rainbow -> yellow hand float - farmer carry with single yellow hand float at side walking/ marching forward/ backward  - relaxed squat at wall -  UE support yellow hand floats: relaxed squats x 10; heel/toe raises x 10; hip add/abd x 10  - TrA set with full hollow noodle pull down to thighs x 10 wide stance; staggered stances x 5 - relaxed squat at wall - staggered stance with kick board row 2 x 10, with added vectors of 11 and 1 o'clock -decompression:  straddling yellow noodle with UE support corner wall: cycling   Pt requires the buoyancy and hydrostatic pressure of water for support, and to offload joints by unweighting joint load by at least 50 % in navel deep water and by at least 75-80% in chest to neck deep water.  Viscosity of the water is needed for resistance of strengthening. Water current perturbations provides challenge to standing balance requiring increased core activation.                                                                                                                              PATIENT EDUCATION:  Education details:intro to aquatic therapy   Person educated: Patient Education method: Explanation Education comprehension: verbalized understanding  HOME EXERCISE PROGRAM: TBA  ASSESSMENT:  CLINICAL IMPRESSION: Pt able to walk unsupported with good confidence in water this visit. Able to progress core engagement with very good toleration.  Improved time for 5x STS.  Pt is making steady progress towards goals. Land therapist to establish HEP in next 3 visits.        Initial impression: Patient is a 63 y.o. f who was seen today for physical therapy evaluation and treatment for LBP. She presents without ad.  Reports pain mostly lumbar spine with some radiation into bilat hips with radiation occasionally down mid calf presenting as a sharp pain nothing that lingers.  Testing demonstrates pt with poor posture, forward leaning.  Lumbar ROM limited mostly in side bending.  She does have pain with returning to vertical from full flexion.  She has core and left hip weakness.  Diagnostics state mild lumber DDD.  I do suspect her pain is mostly due to muscle weakness and poor posture which has slowly increased over the past few months. She will benefit from skilled PT intervention to improve deficits and return her to PLOF.  Will begin in aquatics then transition to land as pain is better managed and strength in core  and left hip improves.  Will also focus on posture and balance.  OBJECTIVE IMPAIRMENTS: decreased activity tolerance, decreased balance, decreased endurance, decreased mobility, decreased ROM, decreased strength, obesity, and pain.   ACTIVITY LIMITATIONS: carrying, lifting, bending, squatting, bathing, and locomotion level  PARTICIPATION LIMITATIONS: meal prep, cleaning, laundry, shopping, community activity, and yard work  PERSONAL FACTORS: Fitness and 1-2 comorbidities: see problem list  are also affecting patient's functional outcome.   REHAB POTENTIAL: Good  CLINICAL DECISION MAKING: Evolving/moderate complexity  EVALUATION COMPLEXITY: Moderate   GOALS: Goals reviewed with patient? Yes  SHORT TERM GOALS: Target date: 06/11/23  Pt will tolerate full  aquatic sessions consistently without increase in pain and with improving function to demonstrate good toleration and effectiveness of intervention.  Baseline: Goal status: Met 06/16/23  2.  Pt will report a 75% decrease in pain with submersion and exercise while engaged Baseline: TBA Goal status: Met 06/16/23  3.  Increase toleration to treadmill at home to 15 minutes and complete 3-4 times weekly Baseline: <10 min Goal status: INITIAL    LONG TERM GOALS: Target date: 07/10/23  Pt to improve on ODI to </= 10% to demonstrate statistically significant Improvement in function. Baseline: 11/45=24% Goal status: INITIAL  2.  Pt will be indep with final HEP's (land and aquatic as appropriate) for continued management of condition Baseline: attempts to walk on treadmill at home Goal status: INITIAL  3.  Pt will improve on 5 X STS test to <or=  13s  to demonstrate improving functional lower extremity strength, transitional movements, and balance Baseline: see above  Goal status: In progress - 06/23/23  4.  Pt will return to full lumbar ROM without pain. Baseline: see chart Goal status: INITIAL  5.  Pt will improve strength of  left hip flex to at least 10lb from contralateral side Baseline:see chart Goal status: INITIAL  6.  Pt will report walking through grocery store in upright position x 30 minutes Baseline:  Goal status: In progress - 06/23/23  PLAN:  PT FREQUENCY: 2x/week  PT DURATION: 8 weeks  PLANNED INTERVENTIONS: 97164- PT Re-evaluation, 97110-Therapeutic exercises, 97530- Therapeutic activity, 97112- Neuromuscular re-education, 97535- Self Care, 16109- Manual therapy, [phone]- Gait training, [phone]- Orthotic Fit/training, [phone]- Aquatic Therapy, [phone]- Electrical stimulation (unattended), [phone]- Ionotophoresis 4mg /ml Dexamethasone, Patient/Family education, Balance training, Stair training, Taping, Dry Needling, Joint mobilization, DME instructions, Cryotherapy, and Moist heat.  PLAN FOR NEXT SESSION: core strengthening, posture retraining, balance, endurance, body mechanics instruction,  Almedia Jacobsen, PTA 06/23/23 5:51 PM [facility] [address] [place], [place], [address] Phone: [phone]   Fax:  [phone]     Date of referral: 04/11/23 (Pt cancelled 2 evaluation appts) Referring provider: Rance Burrows, MD  Referring diagnosis? M54.50 (ICD-10-CM) - Low back pain  Treatment diagnosis? (if different than referring diagnosis) M54.50 (ICD-10-CM) - Low back pain   What was this (referring dx) caused by? Ongoing Issue  Lonne Roan of Condition: Chronic (continuous duration > 3 months)   Laterality: Both  Current Functional Measure Score: Other ODI 24%  Objective measurements identify impairments when they are compared to normal values, the uninvolved extremity, and prior level of function.  [x]  Yes  []  No  Objective assessment of functional ability: Moderate functional limitations   Briefly describe symptoms: pain across low back with occasional raditation lle  How did symptoms start: Unknown  Average pain intensity:  Last  24 hours: 3-4/10  Past week: 3-4/10  How often does the pt experience symptoms? Frequently  How much have the symptoms interfered with usual daily activities? Moderately  How has condition changed since care began at this facility? NA - initial visit  In general, how is the patients overall health? Good   BACK PAIN (STarT Back Screening Tool) Has pain spread down the leg(s) at some time in the last 2 weeks? yes Has there been pain in the shoulder or neck at some time in the last 2 weeks? no Has the pt only walked short distances because of back pain? yes Has patient dressed more slowly because of back pain in the past 2 weeks? yes Does patient think  it's not safe for a person with this condition to be physically active? no Does patient have worrying thoughts a lot of the time? no Does patient feel back pain is terrible and will never get any better? no Has patient stopped enjoying things they usually enjoy? no

## 2023-06-25 ENCOUNTER — Encounter (HOSPITAL_BASED_OUTPATIENT_CLINIC_OR_DEPARTMENT_OTHER): Payer: Self-pay

## 2023-06-25 ENCOUNTER — Ambulatory Visit (HOSPITAL_BASED_OUTPATIENT_CLINIC_OR_DEPARTMENT_OTHER)

## 2023-06-25 DIAGNOSIS — M5459 Other low back pain: Secondary | ICD-10-CM | POA: Diagnosis not present

## 2023-06-25 DIAGNOSIS — R293 Abnormal posture: Secondary | ICD-10-CM

## 2023-06-25 DIAGNOSIS — M6281 Muscle weakness (generalized): Secondary | ICD-10-CM

## 2023-06-25 NOTE — Therapy (Signed)
 OUTPATIENT PHYSICAL THERAPY THORACOLUMBAR TREATMENT   Patient Name: Donna Peterson MRN: [id number] DOB:[birth year], 63 y.o., female Today's Date: 06/25/2023  END OF SESSION:  PT End of Session - 06/25/23 1501     Visit Number 7    Number of Visits 16    Date for PT Re-Evaluation 07/10/23    Authorization Type [employer]    PT Start Time 1518    PT Stop Time 1600    PT Time Calculation (min) 42 min    Activity Tolerance Patient tolerated treatment well    Behavior During Therapy WFL for tasks assessed/performed               Past Medical History:  Diagnosis Date   Anemia    none recent   Anxiety    Asthma    mild, no inhaler use   Hypertension    Past Surgical History:  Procedure Laterality Date   cesarian     x 1   COLONOSCOPY WITH PROPOFOL  N/A 10/21/2013   Procedure: COLONOSCOPY WITH PROPOFOL ;  Surgeon: Mathew Solomon, MD;  Location: [facility];  Service: Endoscopy;  Laterality: N/A;   COLONOSCOPY WITH PROPOFOL  N/A 02/15/2016   Procedure: COLONOSCOPY WITH PROPOFOL ;  Surgeon: Ozell Blunt, MD;  Location: [facility];  Service: Endoscopy;  Laterality: N/A;   HYSTEROSCOPY     for fibroids   There are no active problems to display for this patient.   PCP: Elyn Han MD  REFERRING PROVIDER: Rance Burrows, MD   REFERRING DIAG: M54.50 (ICD-10-CM) - Low back pain   Rationale for Evaluation and Treatment: Rehabilitation  THERAPY DIAG:  Other low back pain  Muscle weakness (generalized)  Abnormal posture  ONSET DATE: 2 years  SUBJECTIVE:                                                                                                                                                                                           SUBJECTIVE STATEMENT: Pt reports mild improvement in pain since starting PT. Able to walk longer distances without  pain.   POOL ACCESS: pt plans to check out the [facility]   From initial evaluation:  LBP x 2 years, covid a few years ago  worked at home stopped exercising gained weight.  Really has bothered me since.  Has progressively gotten worse.  Have tried different meds with any improvement.  I can't tolerate the gym to exercise.  I have started with healty weight management to help lose weight.   PERTINENT HISTORY:  Dr Leighton Punches- Patient presents with persistent low back pain, unimproved by previous treatments. Celebrex and baclofen were ineffective, with baclofen  causing drowsiness. X-ray shows multiple levels of arthritis. NSAIDs are intolerable due to kidney concerns. PT is scheduled, but pain is limiting participation.   PAIN:  Are you having pain? no: NPRS scale: 0/10   Pain location: LB  Pain description:  Aggravating factors: standing 5-10 minutes, walking 15 minutes Relieving factors: sitting resting, lying down, leaning on cart, getting OOB or donning left pants  PRECAUTIONS: None  RED FLAGS: None   WEIGHT BEARING RESTRICTIONS: No  FALLS:  Has patient fallen in last 6 months? 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  NEXT MD VISIT: end of April  OBJECTIVE:  Note: Objective measures were completed at Evaluation unless otherwise noted.  DIAGNOSTIC FINDINGS:  DG Lumbar spine IMPRESSION: 1. Mild multilevel lumbar spine DDD, worse at L2-L3 and L3-L4. 2. Aortic Atherosclerosis (ICD10-I70.0).  PATIENT SURVEYS:  Modified Oswestry 11/45=24%   COGNITION: Overall cognitive status: Within functional limits for tasks assessed     SENSATION: WFL  MUSCLE LENGTH: Hamstrings: Full knee ext R/L/tight   POSTURE: rounded shoulders and forward head  PALPATION: No TTP body habitus inhibits  LUMBAR ROM:   AROM eval  Flexion Full P!  Extension 50% limited P!  Right  lateral flexion 75% limited P!  Left lateral flexion 75% P!  Right rotation   Left rotation    (Blank rows = not tested)  LOWER EXTREMITY ROM:     wfl  LOWER EXTREMITY MMT:    MMT Right eval Left eval  Hip flexion 50.4 22.0  Hip extension    Hip abduction 27.6 29.3  Hip adduction    Hip internal rotation    Hip external rotation    Knee flexion    Knee extension 35.8 42.8  Ankle dorsiflexion    Ankle plantarflexion    Ankle inversion    Ankle eversion     (Blank rows = not tested)  LUMBAR SPECIAL TESTS:   Trendelenburg sign neg: Negative slump test neg L  FUNCTIONAL TESTS:  5 times sit to stand: 17.54 hurt but felt good Timed up and go (TUG): 12.07  06/23/23:  5x STS: 14.96s  GAIT: Distance walked: 400 ft Assistive device utilized: None Level of assistance: Complete Independence Comments: normal pattern initially.  As pt tires pt falls into forward flexed posture, minimal arm swing  TREATMENT   06/25/23:  -LTR -SKTC -HSS manual x30sec ea, with strap 30sec ea TA iso in hooklying 5" x10 -Supine marching with TA 2x10 -sidelying clams -review of log roll and TA activation with transfers -standing lateral flexion 5" x3ea -Seated lumbar flexion stretch 15sec x3 -HEP provided and reviewed with pt      [facility] Adult PT Treatment:                                                DATE: 06/23/23 Pt seen for aquatic therapy today.  Treatment took place in water 3.5-4.75 ft in depth at the [facility] pool. Temp of water was 91.  Pt entered/exited the pool via stairs independently with bilat rail.  - unsupported:   walking forward across pool x 3 laps; backward 2.5 laps - Unsupported:  side  stepping x 2 laps -> - side stepping with arm addct/ abdct x 2 laps with rainbow -> yellow hand float - farmer carry with single yellow hand float at side walking/ marching forward/ backward  - relaxed squat at wall - UE support yellow hand floats: relaxed squats x 10;  heel/toe raises x 10; hip add/abd x 10  - TrA set with full hollow noodle pull down to thighs x 10 wide stance; staggered stances x 5 - relaxed squat at wall - staggered stance with kick board row 2 x 10, with added vectors of 11 and 1 o'clock -decompression: straddling yellow noodle with UE support corner wall: cycling   Pt requires the buoyancy and hydrostatic pressure of water for support, and to offload joints by unweighting joint load by at least 50 % in navel deep water and by at least 75-80% in chest to neck deep water.  Viscosity of the water is needed for resistance of strengthening. Water current perturbations provides challenge to standing balance requiring increased core activation.                                                                                                                              PATIENT EDUCATION:  Education details:intro to aquatic therapy   Person educated: Patient Education method: Explanation Education comprehension: verbalized understanding  HOME EXERCISE PROGRAM: LAND: Access Code: [email or web address] URL: [email or web address] Date: 06/25/2023 Prepared by: Herb Loges   ASSESSMENT:  CLINICAL IMPRESSION: Pt demonstrated good tolerance for initial land visit. Instructions provided with cues prn for proper TA activation. Educated pt in functional carryover of TA activation. Pt described difficulty with transferring out of bed. Instructed pt in proper log roll technique to apply at home. Discussed with pt stretches/exercises she can complete when sitting in chair at work/standing up to alleviate back tightness/pain. Suggested use of heating pad for symptom management. Provided pt with initial land HEP.       Initial impression: Patient is a 63 y.o. f who was seen today for physical therapy evaluation and treatment for LBP. She presents without ad.  Reports pain mostly lumbar spine with some radiation into bilat hips with radiation  occasionally down mid calf presenting as a sharp pain nothing that lingers.  Testing demonstrates pt with poor posture, forward leaning.  Lumbar ROM limited mostly in side bending.  She does have pain with returning to vertical from full flexion.  She has core and left hip weakness.  Diagnostics state mild lumber DDD.  I do suspect her pain is mostly due to muscle weakness and poor posture which has slowly increased over the past few months. She will benefit from skilled PT intervention to improve deficits and return her to PLOF.  Will begin in aquatics then transition to land as pain is better managed and strength in core and left hip improves.  Will also focus on posture and balance.  OBJECTIVE IMPAIRMENTS: decreased activity tolerance, decreased balance, decreased endurance, decreased  mobility, decreased ROM, decreased strength, obesity, and pain.   ACTIVITY LIMITATIONS: carrying, lifting, bending, squatting, bathing, and locomotion level  PARTICIPATION LIMITATIONS: meal prep, cleaning, laundry, shopping, community activity, and yard work  PERSONAL FACTORS: Fitness and 1-2 comorbidities: see problem list  are also affecting patient's functional outcome.   REHAB POTENTIAL: Good  CLINICAL DECISION MAKING: Evolving/moderate complexity  EVALUATION COMPLEXITY: Moderate   GOALS: Goals reviewed with patient? Yes  SHORT TERM GOALS: Target date: 06/11/23  Pt will tolerate full aquatic sessions consistently without increase in pain and with improving function to demonstrate good toleration and effectiveness of intervention.  Baseline: Goal status: Met 06/16/23  2.  Pt will report a 75% decrease in pain with submersion and exercise while engaged Baseline: TBA Goal status: Met 06/16/23  3.  Increase toleration to treadmill at home to 15 minutes and complete 3-4 times weekly Baseline: <10 min Goal status: INITIAL    LONG TERM GOALS: Target date: 07/10/23  Pt to improve on ODI to </= 10% to  demonstrate statistically significant Improvement in function. Baseline: 11/45=24% Goal status: INITIAL  2.  Pt will be indep with final HEP's (land and aquatic as appropriate) for continued management of condition Baseline: attempts to walk on treadmill at home Goal status: INITIAL  3.  Pt will improve on 5 X STS test to <or=  13s  to demonstrate improving functional lower extremity strength, transitional movements, and balance Baseline: see above  Goal status: In progress - 06/23/23  4.  Pt will return to full lumbar ROM without pain. Baseline: see chart Goal status: INITIAL  5.  Pt will improve strength of left hip flex to at least 10lb from contralateral side Baseline:see chart Goal status: INITIAL  6.  Pt will report walking through grocery store in upright position x 30 minutes Baseline:  Goal status: In progress - 06/23/23  PLAN:  PT FREQUENCY: 2x/week  PT DURATION: 8 weeks  PLANNED INTERVENTIONS: 97164- PT Re-evaluation, 97110-Therapeutic exercises, 97530- Therapeutic activity, 97112- Neuromuscular re-education, 97535- Self Care, 16109- Manual therapy, [phone]- Gait training, [phone]- Orthotic Fit/training, [phone]- Aquatic Therapy, [phone]- Electrical stimulation (unattended), [phone]- Ionotophoresis 4mg /ml Dexamethasone, Patient/Family education, Balance training, Stair training, Taping, Dry Needling, Joint mobilization, DME instructions, Cryotherapy, and Moist heat.  PLAN FOR NEXT SESSION: core strengthening, posture retraining, balance, endurance, body mechanics instruction,  Herb Loges, PTA  06/25/23 5:06 PM [facility] [address] [place], [place], [address] Phone: [phone]   Fax:  [phone]     Date of referral: 04/11/23 (Pt cancelled 2 evaluation appts) Referring provider: Rance Burrows, MD  Referring diagnosis? M54.50 (ICD-10-CM) - Low back pain  Treatment diagnosis? (if different than referring  diagnosis) M54.50 (ICD-10-CM) - Low back pain   What was this (referring dx) caused by? Ongoing Issue  Lonne Roan of Condition: Chronic (continuous duration > 3 months)   Laterality: Both  Current Functional Measure Score: Other ODI 24%  Objective measurements identify impairments when they are compared to normal values, the uninvolved extremity, and prior level of function.  [x]  Yes  []  No  Objective assessment of functional ability: Moderate functional limitations   Briefly describe symptoms: pain across low back with occasional raditation lle  How did symptoms start: Unknown  Average pain intensity:  Last 24 hours: 3-4/10  Past week: 3-4/10  How often does the pt experience symptoms? Frequently  How much have the symptoms interfered with usual daily activities? Moderately  How has condition changed since care began at this  facility? NA - initial visit  In general, how is the patients overall health? Good   BACK PAIN (STarT Back Screening Tool) Has pain spread down the leg(s) at some time in the last 2 weeks? yes Has there been pain in the shoulder or neck at some time in the last 2 weeks? no Has the pt only walked short distances because of back pain? yes Has patient dressed more slowly because of back pain in the past 2 weeks? yes Does patient think it's not safe for a person with this condition to be physically active? no Does patient have worrying thoughts a lot of the time? no Does patient feel back pain is terrible and will never get any better? no Has patient stopped enjoying things they usually enjoy? no

## 2023-06-30 ENCOUNTER — Ambulatory Visit (HOSPITAL_BASED_OUTPATIENT_CLINIC_OR_DEPARTMENT_OTHER): Admitting: Physical Therapy

## 2023-06-30 ENCOUNTER — Encounter (HOSPITAL_BASED_OUTPATIENT_CLINIC_OR_DEPARTMENT_OTHER): Payer: Self-pay | Admitting: Physical Therapy

## 2023-06-30 DIAGNOSIS — R293 Abnormal posture: Secondary | ICD-10-CM

## 2023-06-30 DIAGNOSIS — M5459 Other low back pain: Secondary | ICD-10-CM | POA: Diagnosis not present

## 2023-06-30 DIAGNOSIS — M6281 Muscle weakness (generalized): Secondary | ICD-10-CM

## 2023-06-30 NOTE — Therapy (Signed)
 OUTPATIENT PHYSICAL THERAPY THORACOLUMBAR TREATMENT   Patient Name: Donna Peterson MRN: [id number] DOB:08/08/1960, 63 y.o., female Today's Date: 06/30/2023  END OF SESSION:  PT End of Session - 06/30/23 1659     Visit Number 8    Number of Visits 16    Date for PT Re-Evaluation 07/10/23    Authorization Type [employer]    PT Start Time 1700    PT Stop Time 1740    PT Time Calculation (min) 40 min    Activity Tolerance Patient tolerated treatment well    Behavior During Therapy WFL for tasks assessed/performed               Past Medical History:  Diagnosis Date   Anemia    none recent   Anxiety    Asthma    mild, no inhaler use   Hypertension    Past Surgical History:  Procedure Laterality Date   cesarian     x 1   COLONOSCOPY WITH PROPOFOL  N/A 10/21/2013   Procedure: COLONOSCOPY WITH PROPOFOL ;  Surgeon: Mathew Solomon, MD;  Location: [facility];  Service: Endoscopy;  Laterality: N/A;   COLONOSCOPY WITH PROPOFOL  N/A 02/15/2016   Procedure: COLONOSCOPY WITH PROPOFOL ;  Surgeon: Ozell Blunt, MD;  Location: [facility];  Service: Endoscopy;  Laterality: N/A;   HYSTEROSCOPY     for fibroids   There are no active problems to display for this patient.   PCP: Elyn Han MD  REFERRING PROVIDER: Rance Burrows, MD   REFERRING DIAG: M54.50 (ICD-10-CM) - Low back pain   Rationale for Evaluation and Treatment: Rehabilitation  THERAPY DIAG:  Other low back pain  Muscle weakness (generalized)  Abnormal posture  ONSET DATE: 2 years  SUBJECTIVE:                                                                                                                                                                                           SUBJECTIVE STATEMENT: Pt reports overall decrease pain.  She is pleased with her progress in therapy thus far. Will try getting on her treadmill at home   POOL ACCESS: pt plans to check out the [facility].   From initial evaluation:  LBP x  2 years, covid a few years ago worked at home stopped exercising gained weight.  Really has bothered me since.  Has progressively gotten worse.  Have tried different meds with any improvement.  I can't tolerate the gym to exercise.  I have started with healty weight management to help lose weight.   PERTINENT HISTORY:  Dr Leighton Punches- Patient presents with persistent low back pain, unimproved by previous treatments.  Celebrex and baclofen were ineffective, with baclofen causing drowsiness. X-ray shows multiple levels of arthritis. NSAIDs are intolerable due to kidney concerns. PT is scheduled, but pain is limiting participation.   PAIN:  Are you having pain? no: NPRS scale: 0/10   Pain location: LB  Pain description:  Aggravating factors: standing 5-10 minutes, walking 15 minutes Relieving factors: sitting resting, lying down, leaning on cart, getting OOB or donning left pants  PRECAUTIONS: None  RED FLAGS: None   WEIGHT BEARING RESTRICTIONS: No  FALLS:  Has patient fallen in last 6 months? 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  NEXT MD VISIT: end of April  OBJECTIVE:  Note: Objective measures were completed at Evaluation unless otherwise noted.  DIAGNOSTIC FINDINGS:  DG Lumbar spine IMPRESSION: 1. Mild multilevel lumbar spine DDD, worse at L2-L3 and L3-L4. 2. Aortic Atherosclerosis (ICD10-I70.0).  PATIENT SURVEYS:  Modified Oswestry 11/45=24%   COGNITION: Overall cognitive status: Within functional limits for tasks assessed     SENSATION: WFL  MUSCLE LENGTH: Hamstrings: Full knee ext R/L/tight   POSTURE: rounded shoulders and forward head  PALPATION: No TTP body habitus inhibits  LUMBAR ROM:   AROM eval  Flexion Full P!   Extension 50% limited P!  Right lateral flexion 75% limited P!  Left lateral flexion 75% P!  Right rotation   Left rotation    (Blank rows = not tested)  LOWER EXTREMITY ROM:     wfl  LOWER EXTREMITY MMT:    MMT Right eval Left eval  Hip flexion 50.4 22.0  Hip extension    Hip abduction 27.6 29.3  Hip adduction    Hip internal rotation    Hip external rotation    Knee flexion    Knee extension 35.8 42.8  Ankle dorsiflexion    Ankle plantarflexion    Ankle inversion    Ankle eversion     (Blank rows = not tested)  LUMBAR SPECIAL TESTS:   Trendelenburg sign neg: Negative slump test neg L  FUNCTIONAL TESTS:  5 times sit to stand: 17.54 hurt but felt good Timed up and go (TUG): 12.07  06/23/23:  5x STS: 14.96s  GAIT: Distance walked: 400 ft Assistive device utilized: None Level of assistance: Complete Independence Comments: normal pattern initially.  As pt tires pt falls into forward flexed posture, minimal arm swing  TREATMENT  [facility] Adult PT Treatment:                                                DATE: 06/30/23 Pt seen for aquatic therapy today.  Treatment took place in water 3.5-4.75 ft in depth at the [facility] pool. Temp of water was 91.  Pt entered/exited the pool via stairs independently with bilat rail.  - unsupported:   walking forward across pool ,backward /side stepping - side stepping with arm addct/ abdct x 4 laps with rainbow - farmer carry with bilateral then single yellow hand float at side walking/ marching forward/ backward . Cues for TrA engagement - TrA set with full hollow noodle pull down to thighs x 10 wide stance; staggered stances x 5 -Noodle stomp hip in neutral  then external rotation ue on wall and yellow hab x 10 R/L each position.  Cues for maintaining TrA engagement. - calf stretch standing on noodle -Standing balance on noodle: rolling from heels to toes wide stance then standing on top x 15s; small BOS standing balance on  noodle x 15 s after several tries. -L stretch; L stretch with tail wagging -lateral bending resisted by yellow HB R/L x10 (some discomfort with right side bending) - hip hiking bottom step.  VC and demonstration needed.  Good execution for glute engagement.   Pt requires the buoyancy and hydrostatic pressure of water for support, and to offload joints by unweighting joint load by at least 50 % in navel deep water and by at least 75-80% in chest to neck deep water.  Viscosity of the water is needed for resistance of strengthening. Water current perturbations provides challenge to standing balance requiring increased core activation.     06/25/23:  -LTR -SKTC -HSS manual x30sec ea, with strap 30sec ea TA iso in hooklying 5" x10 -Supine marching with TA 2x10 -sidelying clams -review of log roll and TA activation with transfers -standing lateral flexion 5" x3ea -Seated lumbar flexion stretch 15sec x3 -HEP provided and reviewed with pt      [facility] Adult PT Treatment:                                                DATE: 06/23/23 Pt seen for aquatic therapy today.  Treatment took place in water 3.5-4.75 ft in depth at the [facility] pool. Temp of water was 91.  Pt entered/exited the pool via stairs independently with bilat rail.  - unsupported:   walking forward across pool x 3 laps; backward 2.5 laps - Unsupported:  side stepping x 2 laps -> - side stepping with arm addct/ abdct x 2 laps with rainbow -> yellow hand float - farmer carry with single yellow hand float at side walking/ marching forward/ backward  - relaxed squat at wall - UE support yellow hand floats: relaxed squats x 10; heel/toe raises x 10; hip add/abd x 10  - TrA set with full hollow noodle pull down to thighs x 10 wide stance; staggered stances x 5 - relaxed squat at wall - staggered stance with kick board row 2 x 10, with added vectors of 11 and 1 o'clock -decompression: straddling yellow noodle with UE  support corner wall: cycling   Pt requires the buoyancy and hydrostatic pressure of water for support, and to offload joints by unweighting joint load by at least 50 % in navel deep water and by at least 75-80% in chest to neck deep water.  Viscosity of the water is needed for resistance of strengthening. Water current perturbations provides challenge to standing balance requiring increased core activation.  PATIENT EDUCATION:  Education details:intro to aquatic therapy   Person educated: Patient Education method: Explanation Education comprehension: verbalized understanding  HOME EXERCISE PROGRAM: LAND: Access Code: [email or web address] URL: [email or web address] Date: 06/25/2023 Prepared by: Herb Loges   ASSESSMENT:  CLINICAL IMPRESSION: Increased repetitions of exercise with cues throughout to maintain  TrA engagement for core strength and control.  She reports good stretch in left hip with noodle stomp. Introduced balance challenges on noodle with good execution.  She will trial walking on treadmill at home. Goals ongoing     Initial impression: Patient is a 63 y.o. f who was seen today for physical therapy evaluation and treatment for LBP. She presents without ad.  Reports pain mostly lumbar spine with some radiation into bilat hips with radiation occasionally down mid calf presenting as a sharp pain nothing that lingers.  Testing demonstrates pt with poor posture, forward leaning.  Lumbar ROM limited mostly in side bending.  She does have pain with returning to vertical from full flexion.  She has core and left hip weakness.  Diagnostics state mild lumber DDD.  I do suspect her pain is mostly due to muscle weakness and poor posture which has slowly increased over the past few months. She will benefit from skilled PT intervention to improve deficits and  return her to PLOF.  Will begin in aquatics then transition to land as pain is better managed and strength in core and left hip improves.  Will also focus on posture and balance.  OBJECTIVE IMPAIRMENTS: decreased activity tolerance, decreased balance, decreased endurance, decreased mobility, decreased ROM, decreased strength, obesity, and pain.   ACTIVITY LIMITATIONS: carrying, lifting, bending, squatting, bathing, and locomotion level  PARTICIPATION LIMITATIONS: meal prep, cleaning, laundry, shopping, community activity, and yard work  PERSONAL FACTORS: Fitness and 1-2 comorbidities: see problem list  are also affecting patient's functional outcome.   REHAB POTENTIAL: Good  CLINICAL DECISION MAKING: Evolving/moderate complexity  EVALUATION COMPLEXITY: Moderate   GOALS: Goals reviewed with patient? Yes  SHORT TERM GOALS: Target date: 06/11/23  Pt will tolerate full aquatic sessions consistently without increase in pain and with improving function to demonstrate good toleration and effectiveness of intervention.  Baseline: Goal status: Met 06/16/23  2.  Pt will report a 75% decrease in pain with submersion and exercise while engaged Baseline: TBA Goal status: Met 06/16/23  3.  Increase toleration to treadmill at home to 15 minutes and complete 3-4 times weekly Baseline: <10 min Goal status: INITIAL    LONG TERM GOALS: Target date: 07/10/23  Pt to improve on ODI to </= 10% to demonstrate statistically significant Improvement in function. Baseline: 11/45=24% Goal status: INITIAL  2.  Pt will be indep with final HEP's (land and aquatic as appropriate) for continued management of condition Baseline: attempts to walk on treadmill at home Goal status: INITIAL  3.  Pt will improve on 5 X STS test to <or=  13s  to demonstrate improving functional lower extremity strength, transitional movements, and balance Baseline: see above  Goal status: In progress - 06/23/23  4.  Pt will  return to full lumbar ROM without pain. Baseline: see chart Goal status: INITIAL  5.  Pt will improve strength of left hip flex to at least 10lb from contralateral side Baseline:see chart Goal status: INITIAL  6.  Pt will report walking through grocery store in upright position x 30 minutes Baseline:  Goal status: In progress - 06/23/23  PLAN:  PT FREQUENCY: 2x/week  PT DURATION: 8 weeks  PLANNED  INTERVENTIONS: 97164- PT Re-evaluation, 97110-Therapeutic exercises, 97530- Therapeutic activity, 97112- Neuromuscular re-education, [phone]- Self Care, 29562- Manual therapy, [phone]- Gait training, [phone]- Orthotic Fit/training, [phone]- Aquatic Therapy, [phone]- Electrical stimulation (unattended), [phone]- Ionotophoresis 4mg /ml Dexamethasone, Patient/Family education, Balance training, Stair training, Taping, Dry Needling, Joint mobilization, DME instructions, Cryotherapy, and Moist heat.  PLAN FOR NEXT SESSION: core strengthening, posture retraining, balance, endurance, body mechanics instruction,  Lucinda Saber) Kateline Kinkade MPT 06/30/23 5:00 PM [facility] [address] [place], [place], [address] Phone: [phone]   Fax:  [phone]      Date of referral: 04/11/23 (Pt cancelled 2 evaluation appts) Referring provider: Rance Burrows, MD  Referring diagnosis? M54.50 (ICD-10-CM) - Low back pain  Treatment diagnosis? (if different than referring diagnosis) M54.50 (ICD-10-CM) - Low back pain   What was this (referring dx) caused by? Ongoing Issue  Lonne Roan of Condition: Chronic (continuous duration > 3 months)   Laterality: Both  Current Functional Measure Score: Other ODI 24%  Objective measurements identify impairments when they are compared to normal values, the uninvolved extremity, and prior level of function.  [x]  Yes  []  No  Objective assessment of functional ability: Moderate functional limitations   Briefly describe symptoms:  pain across low back with occasional raditation lle  How did symptoms start: Unknown  Average pain intensity:  Last 24 hours: 3-4/10  Past week: 3-4/10  How often does the pt experience symptoms? Frequently  How much have the symptoms interfered with usual daily activities? Moderately  How has condition changed since care began at this facility? NA - initial visit  In general, how is the patients overall health? Good   BACK PAIN (STarT Back Screening Tool) Has pain spread down the leg(s) at some time in the last 2 weeks? yes Has there been pain in the shoulder or neck at some time in the last 2 weeks? no Has the pt only walked short distances because of back pain? yes Has patient dressed more slowly because of back pain in the past 2 weeks? yes Does patient think it's not safe for a person with this condition to be physically active? no Does patient have worrying thoughts a lot of the time? no Does patient feel back pain is terrible and will never get any better? no Has patient stopped enjoying things they usually enjoy? no

## 2023-07-02 ENCOUNTER — Ambulatory Visit (HOSPITAL_BASED_OUTPATIENT_CLINIC_OR_DEPARTMENT_OTHER): Attending: Sports Medicine

## 2023-07-02 ENCOUNTER — Encounter (HOSPITAL_BASED_OUTPATIENT_CLINIC_OR_DEPARTMENT_OTHER): Payer: Self-pay

## 2023-07-02 DIAGNOSIS — R293 Abnormal posture: Secondary | ICD-10-CM | POA: Diagnosis present

## 2023-07-02 DIAGNOSIS — M6281 Muscle weakness (generalized): Secondary | ICD-10-CM | POA: Diagnosis present

## 2023-07-02 DIAGNOSIS — M5459 Other low back pain: Secondary | ICD-10-CM | POA: Diagnosis present

## 2023-07-02 NOTE — Therapy (Signed)
 OUTPATIENT PHYSICAL THERAPY THORACOLUMBAR TREATMENT   Patient Name: Donna Peterson MRN: [id number] DOB:[birth year], 63 y.o., female Today's Date: 07/02/2023  END OF SESSION:  PT End of Session - 07/02/23 1516     Visit Number 9    Number of Visits 16    Date for PT Re-Evaluation 07/10/23    Authorization Type [employer]    PT Start Time 1515    PT Stop Time 1600    PT Time Calculation (min) 45 min    Activity Tolerance Patient tolerated treatment well    Behavior During Therapy WFL for tasks assessed/performed                Past Medical History:  Diagnosis Date   Anemia    none recent   Anxiety    Asthma    mild, no inhaler use   Hypertension    Past Surgical History:  Procedure Laterality Date   cesarian     x 1   COLONOSCOPY WITH PROPOFOL  N/A 10/21/2013   Procedure: COLONOSCOPY WITH PROPOFOL ;  Surgeon: Mathew Solomon, MD;  Location: [facility];  Service: Endoscopy;  Laterality: N/A;   COLONOSCOPY WITH PROPOFOL  N/A 02/15/2016   Procedure: COLONOSCOPY WITH PROPOFOL ;  Surgeon: Ozell Blunt, MD;  Location: [facility];  Service: Endoscopy;  Laterality: N/A;   HYSTEROSCOPY     for fibroids   There are no active problems to display for this patient.   PCP: Elyn Han MD  REFERRING PROVIDER: Rance Burrows, MD   REFERRING DIAG: M54.50 (ICD-10-CM) - Low back pain   Rationale for Evaluation and Treatment: Rehabilitation  THERAPY DIAG:  Muscle weakness (generalized)  Abnormal posture  Other low back pain  ONSET DATE: 2 years  SUBJECTIVE:                                                                                                                                                                                           SUBJECTIVE STATEMENT: Pt reports trying 15 minutes without stopping of walking on her treadmill. She reported she only had mild low back pain with this. "I thought it would hurt more." Feels benefit from HEP. Has been utilizing log roll  technique at home which has been helping.   POOL ACCESS: pt plans to check out the [facility].   From initial evaluation:  LBP x 2 years, covid a few years ago worked at home stopped exercising gained weight.  Really has bothered me since.  Has progressively gotten worse.  Have tried different meds with any improvement.  I can't tolerate the gym to exercise.  I have started with healty weight management  to help lose weight.   PERTINENT HISTORY:  Dr Leighton Punches- Patient presents with persistent low back pain, unimproved by previous treatments. Celebrex and baclofen were ineffective, with baclofen causing drowsiness. X-ray shows multiple levels of arthritis. NSAIDs are intolerable due to kidney concerns. PT is scheduled, but pain is limiting participation.   PAIN:  Are you having pain? no: NPRS scale: 0/10   Pain location: LB  Pain description:  Aggravating factors: standing 5-10 minutes, walking 15 minutes Relieving factors: sitting resting, lying down, leaning on cart, getting OOB or donning left pants  PRECAUTIONS: None  RED FLAGS: None   WEIGHT BEARING RESTRICTIONS: No  FALLS:  Has patient fallen in last 6 months? 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  NEXT MD VISIT: end of April  OBJECTIVE:  Note: Objective measures were completed at Evaluation unless otherwise noted.  DIAGNOSTIC FINDINGS:  DG Lumbar spine IMPRESSION: 1. Mild multilevel lumbar spine DDD, worse at L2-L3 and L3-L4. 2. Aortic Atherosclerosis (ICD10-I70.0).  PATIENT SURVEYS:  Modified Oswestry 11/45=24%   COGNITION: Overall cognitive status: Within functional limits for tasks assessed     SENSATION: WFL  MUSCLE LENGTH: Hamstrings: Full knee ext  R/L/tight   POSTURE: rounded shoulders and forward head  PALPATION: No TTP body habitus inhibits  LUMBAR ROM:   AROM eval  Flexion Full P!  Extension 50% limited P!  Right lateral flexion 75% limited P!  Left lateral flexion 75% P!  Right rotation   Left rotation    (Blank rows = not tested)  LOWER EXTREMITY ROM:     wfl  LOWER EXTREMITY MMT:    MMT Right eval Left eval  Hip flexion 50.4 22.0  Hip extension    Hip abduction 27.6 29.3  Hip adduction    Hip internal rotation    Hip external rotation    Knee flexion    Knee extension 35.8 42.8  Ankle dorsiflexion    Ankle plantarflexion    Ankle inversion    Ankle eversion     (Blank rows = not tested)  LUMBAR SPECIAL TESTS:   Trendelenburg sign neg: Negative slump test neg L  FUNCTIONAL TESTS:  5 times sit to stand: 17.54 hurt but felt good Timed up and go (TUG): 12.07  06/23/23:  5x STS: 14.96s  GAIT: Distance walked: 400 ft Assistive device utilized: None Level of assistance: Complete Independence Comments: normal pattern initially.  As pt tires pt falls into forward flexed posture, minimal arm swing  TREATMENT   07/02/23:  -LTR 5" x10ea -SKTC 30second x2ea  -HSS with strap 30sec x2 ea TA iso in hooklying 5" x15 -Supine marching with TA 2x20 -sidelying clams 3x10 -standing lateral flexion 5" x3ea -Seated lumbar flexion stretch 15sec x3 -nu-step x35min L5   [facility] Adult PT Treatment:                                                DATE: 06/30/23 Pt seen for aquatic therapy today.  Treatment took place in water 3.5-4.75 ft in depth at the [facility] pool. Temp of water was 91.  Pt entered/exited the pool via stairs independently with bilat rail.  -  unsupported:   walking forward across pool ,backward /side stepping - side stepping with arm addct/ abdct x 4 laps with rainbow - farmer carry with bilateral then single yellow hand float at side walking/ marching forward/ backward . Cues for  TrA engagement - TrA set with full hollow noodle pull down to thighs x 10 wide stance; staggered stances x 5 -Noodle stomp hip in neutral then external rotation ue on wall and yellow hab x 10 R/L each position.  Cues for maintaining TrA engagement. - calf stretch standing on noodle -Standing balance on noodle: rolling from heels to toes wide stance then standing on top x 15s; small BOS standing balance on noodle x 15 s after several tries. -L stretch; L stretch with tail wagging -lateral bending resisted by yellow HB R/L x10 (some discomfort with right side bending) - hip hiking bottom step.  VC and demonstration needed.  Good execution for glute engagement.   Pt requires the buoyancy and hydrostatic pressure of water for support, and to offload joints by unweighting joint load by at least 50 % in navel deep water and by at least 75-80% in chest to neck deep water.  Viscosity of the water is needed for resistance of strengthening. Water current perturbations provides challenge to standing balance requiring increased core activation.     06/25/23:  -LTR -SKTC -HSS manual x30sec ea, with strap 30sec ea TA iso in hooklying 5" x10 -Supine marching with TA 2x10 -sidelying clams -review of log roll and TA activation with transfers -standing lateral flexion 5" x3ea -Seated lumbar flexion stretch 15sec x3 -HEP provided and reviewed with pt      [facility] Adult PT Treatment:                                                DATE: 06/23/23 Pt seen for aquatic therapy today.  Treatment took place in water 3.5-4.75 ft in depth at the [facility] pool. Temp of water was 91.  Pt entered/exited the pool via stairs independently with bilat rail.  - unsupported:   walking forward across pool x 3 laps; backward 2.5 laps - Unsupported:  side stepping x 2 laps -> - side stepping with arm addct/ abdct x 2 laps with rainbow -> yellow hand float - farmer carry with single yellow hand float at side  walking/ marching forward/ backward  - relaxed squat at wall - UE support yellow hand floats: relaxed squats x 10; heel/toe raises x 10; hip add/abd x 10  - TrA set with full hollow noodle pull down to thighs x 10 wide stance; staggered stances x 5 - relaxed squat at wall - staggered stance with kick board row 2 x 10, with added vectors of 11 and 1 o'clock -decompression: straddling yellow noodle with UE support corner wall: cycling   Pt requires the buoyancy and hydrostatic pressure of water for support, and to offload joints by unweighting joint load by at least 50 % in navel deep water and by at least 75-80% in chest to neck deep water.  Viscosity of the water is needed for resistance of strengthening. Water current perturbations provides challenge to standing balance requiring increased core activation.  PATIENT EDUCATION:  Education details:intro to aquatic therapy   Person educated: Patient Education method: Explanation Education comprehension: verbalized understanding  HOME EXERCISE PROGRAM: LAND: Access Code: [email or web address] URL: [email or web address] Date: 06/25/2023 Prepared by: Herb Loges   ASSESSMENT:  CLINICAL IMPRESSION: Pt continues to report benefit from lumbar and hip stretching interventions. Increased repetitions with supine marches for core stabilization today with good tolerance. Trialled nu-step at end of session without increase pain. Pt demonstrates good carryover of cues from previous land visit.      Initial impression: Patient is a 63 y.o. f who was seen today for physical therapy evaluation and treatment for LBP. She presents without ad.  Reports pain mostly lumbar spine with some radiation into bilat hips with radiation occasionally down mid calf presenting as a sharp pain nothing that lingers.  Testing demonstrates pt  with poor posture, forward leaning.  Lumbar ROM limited mostly in side bending.  She does have pain with returning to vertical from full flexion.  She has core and left hip weakness.  Diagnostics state mild lumber DDD.  I do suspect her pain is mostly due to muscle weakness and poor posture which has slowly increased over the past few months. She will benefit from skilled PT intervention to improve deficits and return her to PLOF.  Will begin in aquatics then transition to land as pain is better managed and strength in core and left hip improves.  Will also focus on posture and balance.  OBJECTIVE IMPAIRMENTS: decreased activity tolerance, decreased balance, decreased endurance, decreased mobility, decreased ROM, decreased strength, obesity, and pain.   ACTIVITY LIMITATIONS: carrying, lifting, bending, squatting, bathing, and locomotion level  PARTICIPATION LIMITATIONS: meal prep, cleaning, laundry, shopping, community activity, and yard work  PERSONAL FACTORS: Fitness and 1-2 comorbidities: see problem list  are also affecting patient's functional outcome.   REHAB POTENTIAL: Good  CLINICAL DECISION MAKING: Evolving/moderate complexity  EVALUATION COMPLEXITY: Moderate   GOALS: Goals reviewed with patient? Yes  SHORT TERM GOALS: Target date: 06/11/23  Pt will tolerate full aquatic sessions consistently without increase in pain and with improving function to demonstrate good toleration and effectiveness of intervention.  Baseline: Goal status: Met 06/16/23  2.  Pt will report a 75% decrease in pain with submersion and exercise while engaged Baseline: TBA Goal status: Met 06/16/23  3.  Increase toleration to treadmill at home to 15 minutes and complete 3-4 times weekly Baseline: <10 min Goal status: INITIAL    LONG TERM GOALS: Target date: 07/10/23  Pt to improve on ODI to </= 10% to demonstrate statistically significant Improvement in function. Baseline: 11/45=24% Goal status:  INITIAL  2.  Pt will be indep with final HEP's (land and aquatic as appropriate) for continued management of condition Baseline: attempts to walk on treadmill at home Goal status: INITIAL  3.  Pt will improve on 5 X STS test to <or=  13s  to demonstrate improving functional lower extremity strength, transitional movements, and balance Baseline: see above  Goal status: In progress - 06/23/23  4.  Pt will return to full lumbar ROM without pain. Baseline: see chart Goal status: INITIAL  5.  Pt will improve strength of left hip flex to at least 10lb from contralateral side Baseline:see chart Goal status: INITIAL  6.  Pt will report walking through grocery store in upright position x 30 minutes Baseline:  Goal status: In progress - 06/23/23  PLAN:  PT FREQUENCY: 2x/week  PT DURATION: 8 weeks  PLANNED INTERVENTIONS: 96045- PT Re-evaluation,  97110-Therapeutic exercises, 97530- Therapeutic activity, [id number]- Neuromuscular re-education, [phone]- Self Care, 19147- Manual therapy, [phone]- Gait training, [phone]- Orthotic Fit/training, [phone]- Aquatic Therapy, [phone]- Electrical stimulation (unattended), [phone]- Ionotophoresis 4mg /ml Dexamethasone, Patient/Family education, Balance training, Stair training, Taping, Dry Needling, Joint mobilization, DME instructions, Cryotherapy, and Moist heat.  PLAN FOR NEXT SESSION: core strengthening, posture retraining, balance, endurance, body mechanics instruction,  Herb Loges, PTA  07/02/23 4:29 PM [facility] [address] [place], [place], [address] Phone: [phone]   Fax:  [phone]      Date of referral: 04/11/23 (Pt cancelled 2 evaluation appts) Referring provider: Rance Burrows, MD  Referring diagnosis? M54.50 (ICD-10-CM) - Low back pain  Treatment diagnosis? (if different than referring diagnosis) M54.50 (ICD-10-CM) - Low back pain   What was this (referring dx) caused by? Ongoing  Issue  Lonne Roan of Condition: Chronic (continuous duration > 3 months)   Laterality: Both  Current Functional Measure Score: Other ODI 24%  Objective measurements identify impairments when they are compared to normal values, the uninvolved extremity, and prior level of function.  [x]  Yes  []  No  Objective assessment of functional ability: Moderate functional limitations   Briefly describe symptoms: pain across low back with occasional raditation lle  How did symptoms start: Unknown  Average pain intensity:  Last 24 hours: 3-4/10  Past week: 3-4/10  How often does the pt experience symptoms? Frequently  How much have the symptoms interfered with usual daily activities? Moderately  How has condition changed since care began at this facility? NA - initial visit  In general, how is the patients overall health? Good   BACK PAIN (STarT Back Screening Tool) Has pain spread down the leg(s) at some time in the last 2 weeks? yes Has there been pain in the shoulder or neck at some time in the last 2 weeks? no Has the pt only walked short distances because of back pain? yes Has patient dressed more slowly because of back pain in the past 2 weeks? yes Does patient think it's not safe for a person with this condition to be physically active? no Does patient have worrying thoughts a lot of the time? no Does patient feel back pain is terrible and will never get any better? no Has patient stopped enjoying things they usually enjoy? no

## 2023-07-06 ENCOUNTER — Other Ambulatory Visit: Payer: Self-pay

## 2023-07-07 ENCOUNTER — Ambulatory Visit (HOSPITAL_BASED_OUTPATIENT_CLINIC_OR_DEPARTMENT_OTHER): Admitting: Physical Therapy

## 2023-07-09 ENCOUNTER — Ambulatory Visit (HOSPITAL_BASED_OUTPATIENT_CLINIC_OR_DEPARTMENT_OTHER)

## 2023-07-14 ENCOUNTER — Other Ambulatory Visit: Payer: Self-pay

## 2023-07-14 MED ORDER — WEGOVY 1 MG/0.5ML ~~LOC~~ SOAJ
1.0000 mg | SUBCUTANEOUS | 1 refills | Status: AC
  Filled 2023-07-14: qty 2, 28d supply, fill #0
  Filled 2023-07-29 – 2023-08-05 (×2): qty 2, 28d supply, fill #1

## 2023-07-29 ENCOUNTER — Other Ambulatory Visit: Payer: Self-pay

## 2023-07-30 ENCOUNTER — Ambulatory Visit (HOSPITAL_BASED_OUTPATIENT_CLINIC_OR_DEPARTMENT_OTHER): Admitting: Physical Therapy

## 2023-07-30 ENCOUNTER — Encounter (HOSPITAL_BASED_OUTPATIENT_CLINIC_OR_DEPARTMENT_OTHER): Payer: Self-pay | Admitting: Physical Therapy

## 2023-07-30 DIAGNOSIS — M5459 Other low back pain: Secondary | ICD-10-CM

## 2023-07-30 DIAGNOSIS — M6281 Muscle weakness (generalized): Secondary | ICD-10-CM

## 2023-07-30 DIAGNOSIS — R293 Abnormal posture: Secondary | ICD-10-CM

## 2023-07-30 NOTE — Therapy (Signed)
 OUTPATIENT PHYSICAL THERAPY THORACOLUMBAR TREATMENT PHYSICAL THERAPY DISCHARGE SUMMARY  Visits from Start of Care: 10  Current functional level related to goals / functional outcomes: indep   Remaining deficits: Occasional lb discomfort   Education / Equipment: Management of condition/ HEP   Patient agrees to discharge. Patient goals were all met but one. Patient is being discharged due to meeting the stated rehab goals.   Patient Name: Donna Peterson MRN: [id number] DOB:1961-01-28, 63 y.o., female Today's Date: 07/30/2023  END OF SESSION:  PT End of Session - 07/30/23 1710     Visit Number 10    Number of Visits 16    Date for PT Re-Evaluation 07/31/23    Authorization Type [employer]    PT Start Time 1618    PT Stop Time 1700    PT Time Calculation (min) 42 min    Activity Tolerance Patient tolerated treatment well    Behavior During Therapy WFL for tasks assessed/performed                 Past Medical History:  Diagnosis Date   Anemia    none recent   Anxiety    Asthma    mild, no inhaler use   Hypertension    Past Surgical History:  Procedure Laterality Date   cesarian     x 1   COLONOSCOPY WITH PROPOFOL  N/A 10/21/2013   Procedure: COLONOSCOPY WITH PROPOFOL ;  Surgeon: Mathew Solomon, MD;  Location: [facility];  Service: Endoscopy;  Laterality: N/A;   COLONOSCOPY WITH PROPOFOL  N/A 02/15/2016   Procedure: COLONOSCOPY WITH PROPOFOL ;  Surgeon: Ozell Blunt, MD;  Location: [facility];  Service: Endoscopy;  Laterality: N/A;   HYSTEROSCOPY     for fibroids   There are no active problems to display for this patient.   PCP: Elyn Han MD  REFERRING PROVIDER: Rance Burrows, MD   REFERRING DIAG: M54.50 (ICD-10-CM) - Low back pain   Rationale for Evaluation and Treatment: Rehabilitation  THERAPY DIAG:  Muscle weakness (generalized)  Abnormal posture  Other low back pain  ONSET DATE: 2 years  SUBJECTIVE:                                                                                                                                                                                            SUBJECTIVE STATEMENT: Pt reports she is doing great.  No LBP and has returned to working out  POOL ACCESS: pt plans to check out the [facility].   From initial evaluation:  LBP x 2 years, covid a few years ago worked at home stopped exercising gained weight.  Really has  bothered me since.  Has progressively gotten worse.  Have tried different meds with any improvement.  I can't tolerate the gym to exercise.  I have started with healty weight management to help lose weight.   PERTINENT HISTORY:  Dr Leighton Punches- Patient presents with persistent low back pain, unimproved by previous treatments. Celebrex and baclofen were ineffective, with baclofen causing drowsiness. X-ray shows multiple levels of arthritis. NSAIDs are intolerable due to kidney concerns. PT is scheduled, but pain is limiting participation.   PAIN:  Are you having pain? no: NPRS scale: 0/10   Pain location: LB  Pain description:  Aggravating factors: standing 5-10 minutes, walking 15 minutes Relieving factors: sitting resting, lying down, leaning on cart, getting OOB or donning left pants  PRECAUTIONS: None  RED FLAGS: None   WEIGHT BEARING RESTRICTIONS: No  FALLS:  Has patient fallen in last 6 months? 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  NEXT MD VISIT: end of April  OBJECTIVE:  Note: Objective measures were completed at Evaluation unless otherwise noted.  DIAGNOSTIC FINDINGS:  DG Lumbar spine IMPRESSION: 1. Mild multilevel lumbar spine DDD, worse at L2-L3 and L3-L4. 2. Aortic Atherosclerosis (ICD10-I70.0).  PATIENT SURVEYS:   Modified Oswestry 11/45=24%  07/30/23: 3/45=8%  COGNITION: Overall cognitive status: Within functional limits for tasks assessed     SENSATION: WFL  MUSCLE LENGTH: Hamstrings: Full knee ext R/L/tight   POSTURE: rounded shoulders and forward head  PALPATION: No TTP body habitus inhibits  LUMBAR ROM:   AROM eval 07/30/23  Flexion Full P! Full No P!  Extension 50% limited P! full  Right lateral flexion 75% limited P! full  Left lateral flexion 75% P! full  Right rotation    Left rotation     (Blank rows = not tested)  LOWER EXTREMITY ROM:     wfl  LOWER EXTREMITY MMT:    MMT Right eval Left eval Left 07/30/23  Hip flexion 50.4 22.0 64.0  Hip extension     Hip abduction 27.6 29.3   Hip adduction     Hip internal rotation     Hip external rotation     Knee flexion     Knee extension 35.8 42.8   Ankle dorsiflexion     Ankle plantarflexion     Ankle inversion     Ankle eversion      (Blank rows = not tested)  LUMBAR SPECIAL TESTS:   Trendelenburg sign neg: Negative slump test neg L  FUNCTIONAL TESTS:  5 times sit to stand: 17.54 hurt but felt good Timed up and go (TUG): 12.07  06/23/23:  5x STS: 14.96s 07/30/23: 5 x STS 12.84  GAIT: Distance walked: 400 ft Assistive device utilized: None Level of assistance: Complete Independence Comments: normal pattern initially.  As pt tires pt falls into forward flexed posture, minimal arm swing  TREATMENT  Pt seen for aquatic therapy today.  Treatment took place in water 3.5-4.75 ft in depth at the [facility] pool. Temp of water was 91.  Pt entered/exited the pool via stairs independently with bilat rail.  Exercises - Hand buoy carry: Forward and Backward; bilaterally->unilaterally   - Side Stepping with Hand Floats  - Noodle Press   - Leg swings flex/ext   - Leg Swing out to the side  -  Squat  - Standing 'L' Stretch at [facility]   - Cycling on noodle/noodle wrapped under shoulders   Pt requires  the buoyancy and hydrostatic pressure of water for support, and to offload joints by unweighting joint load by at least 50 % in navel deep water and by at least 75-80% in chest to neck deep water.  Viscosity of the water is needed for resistance of strengthening. Water current perturbations provides challenge to standing balance requiring increased core activation.  07/02/23:  -LTR 5" x10ea -SKTC 30second x2ea  -HSS with strap 30sec x2 ea TA iso in hooklying 5" x15 -Supine marching with TA 2x20 -sidelying clams 3x10 -standing lateral flexion 5" x3ea -Seated lumbar flexion stretch 15sec x3 -nu-step x46min L5   [facility] Adult PT Treatment:                                                DATE: 06/30/23 Pt seen for aquatic therapy today.  Treatment took place in water 3.5-4.75 ft in depth at the [facility] pool. Temp of water was 91.  Pt entered/exited the pool via stairs independently with bilat rail.  - unsupported:   walking forward across pool ,backward /side stepping - side stepping with arm addct/ abdct x 4 laps with rainbow - farmer carry with bilateral then single yellow hand float at side walking/ marching forward/ backward . Cues for TrA engagement - TrA set with full hollow noodle pull down to thighs x 10 wide stance; staggered stances x 5 -Noodle stomp hip in neutral then external rotation ue on wall and yellow hab x 10 R/L each position.  Cues for maintaining TrA engagement. - calf stretch standing on noodle -Standing balance on noodle: rolling from heels to toes wide stance then standing on top x 15s; small BOS standing balance on noodle x 15 s after several tries. -L stretch; L stretch with tail wagging -lateral bending resisted by yellow HB R/L x10 (some discomfort with right side bending) - hip hiking bottom step.  VC and demonstration needed.  Good execution for glute engagement.   Pt requires the buoyancy and hydrostatic pressure of water for support, and to offload  joints by unweighting joint load by at least 50 % in navel deep water and by at least 75-80% in chest to neck deep water.  Viscosity of the water is needed for resistance of strengthening. Water current perturbations provides challenge to standing balance requiring increased core activation.     06/25/23:  -LTR -SKTC -HSS manual x30sec ea, with strap 30sec ea TA iso in hooklying 5" x10 -Supine marching with TA 2x10 -sidelying clams -review of log roll and TA activation with transfers -standing lateral flexion 5" x3ea -Seated lumbar flexion stretch 15sec x3 -HEP provided and reviewed with pt      [facility] Adult PT Treatment:                                                DATE: 06/23/23 Pt seen for aquatic therapy today.  Treatment took place in water 3.5-4.75 ft in depth at the [facility] pool. Temp of water was 91.  Pt entered/exited the pool via stairs independently with bilat rail.  - unsupported:  walking forward across pool x 3 laps; backward 2.5 laps - Unsupported:  side stepping x 2 laps -> - side stepping with arm addct/ abdct x 2 laps with rainbow -> yellow hand float - farmer carry with single yellow hand float at side walking/ marching forward/ backward  - relaxed squat at wall - UE support yellow hand floats: relaxed squats x 10; heel/toe raises x 10; hip add/abd x 10  - TrA set with full hollow noodle pull down to thighs x 10 wide stance; staggered stances x 5 - relaxed squat at wall - staggered stance with kick board row 2 x 10, with added vectors of 11 and 1 o'clock -decompression: straddling yellow noodle with UE support corner wall: cycling   Pt requires the buoyancy and hydrostatic pressure of water for support, and to offload joints by unweighting joint load by at least 50 % in navel deep water and by at least 75-80% in chest to neck deep water.  Viscosity of the water is needed for resistance of strengthening. Water current perturbations provides challenge  to standing balance requiring increased core activation.                                                                                                                              PATIENT EDUCATION:  Education details:intro to aquatic therapy   Person educated: Patient Education method: Explanation Education comprehension: verbalized understanding  HOME EXERCISE PROGRAM: LAND: Access Code: [email or web address] URL: [email or web address] Date: 06/25/2023 Prepared by: Herb Loges  Aquatics Access Code: [email or web address] URL: [email or web address] Date: 07/30/2023 Prepared by: Frankie Hopelynn Gartland  This aquatic home exercise program from [employer] utilizes pictures from land based exercises, but has been adapted prior to lamination and issuance.    Exercises - Hand buoy carry: Forward and Backward; bilaterally->unilaterally  - 1-3 x weekly - Side Stepping with Hand Floats  - 1 x daily - 1-3 x weekly - Noodle Press  - 1 x daily - 1-3 x weekly - 1-2 sets - 10 reps - Leg swings flex/ext  - 1 x daily - 1-3 x weekly - 1-2 sets - 10 reps - Leg Swing out to the side  - 1 x daily - 1-3 x weekly - 1-2 sets - 10 reps - Squat  - 1 x daily - 1-3 x weekly - 1-2 sets - 10 reps - Standing 'L' Stretch at [employer]  - 1 x daily - 1-3 x weekly - 1 sets - 3 reps - 10-20s hold - Cycling on noodle/noodle wrapped under shoulders  - 1 x daily - 1-3 x weekly - 3 sets - 10 reps Issued  ASSESSMENT:  CLINICAL IMPRESSION: Pt re-certed for 1 more visit to create, instruct and issue final aquatic HEP.  She reports having 0/10 lbp in last few weeks.  She has joined a gym with pool access and signed up for water aerobic classes and has returned to walking on treadmill 3 days a week  for 15 minutes. She has met all goals other than 1 as she has not tried to walk thrugh grocery store for 30 minutes but feels she would tolerate.  She is instructed on final aquatic HEP and demonstrates good understanding and  indep.  She has reached her max potential in setting and is ready for DC.   OBJECTIVE IMPAIRMENTS: decreased activity tolerance, decreased balance, decreased endurance, decreased mobility, decreased ROM, decreased strength, obesity, and pain.   ACTIVITY LIMITATIONS: carrying, lifting, bending, squatting, bathing, and locomotion level  PARTICIPATION LIMITATIONS: meal prep, cleaning, laundry, shopping, community activity, and yard work  PERSONAL FACTORS: Fitness and 1-2 comorbidities: see problem list are also affecting patient's functional outcome.   REHAB POTENTIAL: Good  CLINICAL DECISION MAKING: Evolving/moderate complexity  EVALUATION COMPLEXITY: Moderate   GOALS: Goals reviewed with patient? Yes  SHORT TERM GOALS: Target date: 06/11/23  Pt will tolerate full aquatic sessions consistently without increase in pain and with improving function to demonstrate good toleration and effectiveness of intervention.  Baseline: Goal status: Met 06/16/23  2.  Pt will report a 75% decrease in pain with submersion and exercise while engaged Baseline: TBA Goal status: Met 06/16/23  3.  Increase toleration to treadmill at home to 15 minutes and complete 3-4 times weekly Baseline: <10 min Goal status: Met 07/30/23    LONG TERM GOALS: Target date: 07/31/23  Pt to improve on ODI to </= 10% to demonstrate statistically significant Improvement in function. Baseline: 11/45=24%; 3/45=8% Goal status: Met 07/30/23  2.  Pt will be indep with final HEP's (land and aquatic as appropriate) for continued management of condition Baseline: attempts to walk on treadmill at home Goal status:Met 07/30/23  3.  Pt will improve on 5 X STS test to <or=  13s  to demonstrate improving functional lower extremity strength, transitional movements, and balance Baseline: see above  Goal status: In progress - 06/23/23; Met 07/30/23  4.  Pt will return to full lumbar ROM without pain. Baseline: see chart Goal status:  Met 07/30/23  5.  Pt will improve strength of left hip flex to at least 10lb from contralateral side Baseline:see chart Goal status: Met 07/30/23  6.  Pt will report walking through grocery store in upright position x 30 minutes Baseline:  Goal status: In progress - 06/23/23;  Not tested although pt believes she would tolerate 07/30/23  PLAN:  PT FREQUENCY: 2x/week  PT DURATION: 8 weeks  PLANNED INTERVENTIONS: 97164- PT Re-evaluation, 97110-Therapeutic exercises, 97530- Therapeutic activity, 97112- Neuromuscular re-education, 97535- Self Care, 16109- Manual therapy, [phone]- Gait training, [phone]- Orthotic Fit/training, [phone]- Aquatic Therapy, [phone]- Electrical stimulation (unattended), [phone]- Ionotophoresis 4mg /ml Dexamethasone, Patient/Family education, Balance training, Stair training, Taping, Dry Needling, Joint mobilization, DME instructions, Cryotherapy, and Moist heat.  PLAN FOR NEXT SESSION: core strengthening, posture retraining, balance, endurance, body mechanics instruction,  Lucinda Saber) Fortunata Betty MPT 07/30/23 5:12 PM [facility] [address] [place], [place], [address] Phone: [phone]   Fax:  [phone]       Date of referral: 04/11/23 (Pt cancelled 2 evaluation appts) Referring provider: Rance Burrows, MD  Referring diagnosis? M54.50 (ICD-10-CM) - Low back pain  Treatment diagnosis? (if different than referring diagnosis) M54.50 (ICD-10-CM) - Low back pain   What was this (referring dx) caused by? Ongoing Issue  Lonne Roan of Condition: Chronic (continuous duration > 3 months)   Laterality: Both  Current Functional Measure Score: Other ODI 24%  Objective measurements identify impairments when they are compared to normal  values, the uninvolved extremity, and prior level of function.  [x]  Yes  []  No  Objective assessment of functional ability: Moderate functional limitations   Briefly describe symptoms: pain  across low back with occasional raditation lle  How did symptoms start: Unknown  Average pain intensity:  Last 24 hours: 3-4/10  Past week: 3-4/10  How often does the pt experience symptoms? Frequently  How much have the symptoms interfered with usual daily activities? Moderately  How has condition changed since care began at this facility? NA - initial visit  In general, how is the patients overall health? Good   BACK PAIN (STarT Back Screening Tool) Has pain spread down the leg(s) at some time in the last 2 weeks? yes Has there been pain in the shoulder or neck at some time in the last 2 weeks? no Has the pt only walked short distances because of back pain? yes Has patient dressed more slowly because of back pain in the past 2 weeks? yes Does patient think it's not safe for a person with this condition to be physically active? no Does patient have worrying thoughts a lot of the time? no Does patient feel back pain is terrible and will never get any better? no Has patient stopped enjoying things they usually enjoy? no

## 2023-08-04 ENCOUNTER — Other Ambulatory Visit: Payer: Self-pay

## 2023-08-04 MED ORDER — HYDROCHLOROTHIAZIDE 25 MG PO TABS
25.0000 mg | ORAL_TABLET | Freq: Every morning | ORAL | 3 refills | Status: AC
  Filled 2023-08-04 – 2023-08-23 (×2): qty 30, 30d supply, fill #0
  Filled 2023-09-18: qty 30, 30d supply, fill #1
  Filled 2023-10-19: qty 30, 30d supply, fill #2
  Filled 2023-11-17: qty 30, 30d supply, fill #3
  Filled 2023-12-15: qty 30, 30d supply, fill #4
  Filled 2024-01-17: qty 30, 30d supply, fill #5
  Filled 2024-02-14: qty 30, 30d supply, fill #6

## 2023-08-04 MED ORDER — ZOLPIDEM TARTRATE 10 MG PO TABS
10.0000 mg | ORAL_TABLET | Freq: Every evening | ORAL | 5 refills | Status: AC
  Filled 2023-08-04 – 2023-08-17 (×2): qty 30, 30d supply, fill #0
  Filled 2023-09-15: qty 30, 30d supply, fill #1
  Filled 2023-10-13: qty 30, 30d supply, fill #2
  Filled 2023-11-10: qty 30, 30d supply, fill #3

## 2023-08-04 MED ORDER — NORETHINDRONE ACETATE 5 MG PO TABS
5.0000 mg | ORAL_TABLET | Freq: Every day | ORAL | 11 refills | Status: AC
  Filled 2023-08-04: qty 30, 30d supply, fill #0

## 2023-08-04 MED ORDER — IRBESARTAN 150 MG PO TABS
150.0000 mg | ORAL_TABLET | Freq: Every day | ORAL | 3 refills | Status: AC
  Filled 2023-08-04: qty 90, 90d supply, fill #0
  Filled 2023-08-23: qty 30, 30d supply, fill #0
  Filled 2023-09-18: qty 30, 30d supply, fill #1
  Filled 2023-10-19: qty 30, 30d supply, fill #2
  Filled 2023-11-17: qty 30, 30d supply, fill #3
  Filled 2023-12-15: qty 30, 30d supply, fill #4
  Filled 2024-01-17: qty 30, 30d supply, fill #5
  Filled 2024-02-14: qty 30, 30d supply, fill #6

## 2023-08-04 MED ORDER — CLONAZEPAM 1 MG PO TABS
1.0000 mg | ORAL_TABLET | Freq: Every day | ORAL | 3 refills | Status: DC
  Filled 2023-08-04: qty 30, 30d supply, fill #0

## 2023-08-04 MED ORDER — CLONAZEPAM 1 MG PO TABS
1.0000 mg | ORAL_TABLET | Freq: Two times a day (BID) | ORAL | 5 refills | Status: DC | PRN
  Filled 2023-08-25: qty 60, 30d supply, fill #0
  Filled 2023-09-22: qty 60, 30d supply, fill #1
  Filled 2023-10-20: qty 60, 30d supply, fill #2

## 2023-08-04 MED ORDER — VITAMIN D (ERGOCALCIFEROL) 1.25 MG (50000 UNIT) PO CAPS
50000.0000 [IU] | ORAL_CAPSULE | ORAL | 2 refills | Status: DC
  Filled 2023-08-04 – 2023-09-01 (×2): qty 4, 28d supply, fill #0
  Filled 2023-09-25: qty 4, 28d supply, fill #1

## 2023-08-04 MED ORDER — BISOPROLOL FUMARATE 10 MG PO TABS
10.0000 mg | ORAL_TABLET | Freq: Every day | ORAL | 3 refills | Status: AC
  Filled 2023-08-04 – 2023-09-01 (×2): qty 30, 30d supply, fill #0
  Filled 2023-09-27: qty 30, 30d supply, fill #1
  Filled 2023-11-02: qty 30, 30d supply, fill #2
  Filled 2023-11-30: qty 30, 30d supply, fill #3
  Filled 2023-12-29: qty 30, 30d supply, fill #4

## 2023-08-07 ENCOUNTER — Other Ambulatory Visit: Payer: Self-pay

## 2023-08-12 ENCOUNTER — Other Ambulatory Visit: Payer: Self-pay

## 2023-08-12 MED ORDER — ONDANSETRON HCL 4 MG PO TABS
4.0000 mg | ORAL_TABLET | Freq: Four times a day (QID) | ORAL | 0 refills | Status: DC | PRN
  Filled 2023-08-12: qty 120, 30d supply, fill #0

## 2023-08-12 MED ORDER — WEGOVY 1.7 MG/0.75ML ~~LOC~~ SOAJ
1.7000 mg | SUBCUTANEOUS | 1 refills | Status: DC
  Filled 2023-08-12: qty 3, 28d supply, fill #0
  Filled 2023-09-01: qty 3, 30d supply, fill #0
  Filled 2023-09-27: qty 3, 30d supply, fill #1

## 2023-08-13 ENCOUNTER — Other Ambulatory Visit: Payer: Self-pay

## 2023-08-17 ENCOUNTER — Other Ambulatory Visit: Payer: Self-pay

## 2023-08-24 ENCOUNTER — Other Ambulatory Visit: Payer: Self-pay

## 2023-08-25 ENCOUNTER — Other Ambulatory Visit: Payer: Self-pay

## 2023-09-01 ENCOUNTER — Other Ambulatory Visit: Payer: Self-pay

## 2023-09-02 ENCOUNTER — Other Ambulatory Visit: Payer: Self-pay

## 2023-09-15 ENCOUNTER — Other Ambulatory Visit: Payer: Self-pay

## 2023-09-18 ENCOUNTER — Other Ambulatory Visit: Payer: Self-pay

## 2023-09-22 ENCOUNTER — Other Ambulatory Visit: Payer: Self-pay

## 2023-09-25 ENCOUNTER — Other Ambulatory Visit: Payer: Self-pay

## 2023-09-28 ENCOUNTER — Other Ambulatory Visit: Payer: Self-pay

## 2023-10-13 ENCOUNTER — Other Ambulatory Visit: Payer: Self-pay

## 2023-10-19 ENCOUNTER — Other Ambulatory Visit: Payer: Self-pay

## 2023-10-20 ENCOUNTER — Other Ambulatory Visit: Payer: Self-pay

## 2023-10-21 ENCOUNTER — Other Ambulatory Visit: Payer: Self-pay

## 2023-10-30 ENCOUNTER — Other Ambulatory Visit: Payer: Self-pay

## 2023-11-02 ENCOUNTER — Other Ambulatory Visit: Payer: Self-pay

## 2023-11-03 ENCOUNTER — Other Ambulatory Visit: Payer: Self-pay

## 2023-11-03 MED ORDER — VITAMIN D (ERGOCALCIFEROL) 1.25 MG (50000 UNIT) PO CAPS
50000.0000 [IU] | ORAL_CAPSULE | ORAL | 2 refills | Status: DC
  Filled 2023-11-03: qty 4, 28d supply, fill #0
  Filled 2023-11-27: qty 4, 28d supply, fill #1
  Filled 2023-12-15 – 2023-12-25 (×2): qty 4, 28d supply, fill #2

## 2023-11-03 MED ORDER — WEGOVY 1.7 MG/0.75ML ~~LOC~~ SOAJ
1.7000 mg | SUBCUTANEOUS | 1 refills | Status: DC
  Filled 2023-11-03: qty 3, 28d supply, fill #0
  Filled 2023-11-27: qty 3, 28d supply, fill #1

## 2023-11-04 ENCOUNTER — Other Ambulatory Visit: Payer: Self-pay

## 2023-11-10 ENCOUNTER — Other Ambulatory Visit: Payer: Self-pay

## 2023-11-16 ENCOUNTER — Other Ambulatory Visit: Payer: Self-pay

## 2023-11-16 MED ORDER — CLONAZEPAM 1 MG PO TABS
1.0000 mg | ORAL_TABLET | Freq: Two times a day (BID) | ORAL | 3 refills | Status: AC | PRN
  Filled 2023-11-16 (×2): qty 60, 30d supply, fill #0
  Filled 2023-12-15: qty 60, 30d supply, fill #1
  Filled 2024-01-12: qty 60, 30d supply, fill #2
  Filled 2024-02-09: qty 60, 30d supply, fill #3

## 2023-11-16 MED ORDER — METRONIDAZOLE 0.75 % EX CREA
TOPICAL_CREAM | Freq: Two times a day (BID) | CUTANEOUS | 3 refills | Status: AC
  Filled 2023-11-16: qty 45, 30d supply, fill #0

## 2023-11-16 MED ORDER — FLUTICASONE PROPIONATE 50 MCG/ACT NA SUSP
2.0000 | Freq: Every day | NASAL | 6 refills | Status: AC | PRN
  Filled 2023-11-16: qty 16, 30d supply, fill #0

## 2023-11-17 ENCOUNTER — Other Ambulatory Visit: Payer: Self-pay

## 2023-11-18 ENCOUNTER — Other Ambulatory Visit: Payer: Self-pay

## 2023-11-30 ENCOUNTER — Other Ambulatory Visit: Payer: Self-pay

## 2023-12-02 ENCOUNTER — Other Ambulatory Visit: Payer: Self-pay

## 2023-12-07 ENCOUNTER — Other Ambulatory Visit: Payer: Self-pay

## 2023-12-09 ENCOUNTER — Other Ambulatory Visit: Payer: Self-pay

## 2023-12-09 MED ORDER — ZOLPIDEM TARTRATE 10 MG PO TABS
10.0000 mg | ORAL_TABLET | Freq: Every day | ORAL | 1 refills | Status: DC
  Filled 2023-12-09: qty 30, 30d supply, fill #0
  Filled 2024-01-06: qty 30, 30d supply, fill #1

## 2023-12-15 ENCOUNTER — Other Ambulatory Visit: Payer: Self-pay

## 2023-12-15 MED ORDER — WEGOVY 1.7 MG/0.75ML ~~LOC~~ SOAJ
1.7000 mg | SUBCUTANEOUS | 1 refills | Status: AC
  Filled 2023-12-15 – 2023-12-25 (×2): qty 3, 28d supply, fill #0

## 2023-12-25 ENCOUNTER — Other Ambulatory Visit: Payer: Self-pay

## 2023-12-31 ENCOUNTER — Other Ambulatory Visit: Payer: Self-pay

## 2024-01-06 ENCOUNTER — Other Ambulatory Visit: Payer: Self-pay

## 2024-01-07 ENCOUNTER — Other Ambulatory Visit: Payer: Self-pay

## 2024-01-07 MED ORDER — CLONAZEPAM 1 MG PO TABS
1.0000 mg | ORAL_TABLET | Freq: Two times a day (BID) | ORAL | 5 refills | Status: AC
  Filled 2024-03-08 (×2): qty 60, 30d supply, fill #0
  Filled 2024-04-05: qty 60, 30d supply, fill #1

## 2024-01-07 MED ORDER — ZOLPIDEM TARTRATE 10 MG PO TABS
10.0000 mg | ORAL_TABLET | Freq: Every evening | ORAL | 5 refills | Status: AC
  Filled 2024-02-03: qty 30, 30d supply, fill #0
  Filled 2024-03-02: qty 30, 30d supply, fill #1
  Filled 2024-03-30: qty 30, 30d supply, fill #2

## 2024-01-07 MED ORDER — WEGOVY 1.7 MG/0.75ML ~~LOC~~ SOAJ
1.7000 mg | SUBCUTANEOUS | 5 refills | Status: AC
  Filled 2024-01-22: qty 3, 28d supply, fill #0

## 2024-01-07 MED ORDER — BISOPROLOL FUMARATE 10 MG PO TABS
10.0000 mg | ORAL_TABLET | Freq: Every day | ORAL | 11 refills | Status: AC
  Filled 2024-01-28: qty 30, 30d supply, fill #0
  Filled 2024-03-02: qty 30, 30d supply, fill #1
  Filled 2024-03-30: qty 30, 30d supply, fill #2

## 2024-01-07 MED ORDER — IRBESARTAN 150 MG PO TABS
150.0000 mg | ORAL_TABLET | Freq: Every day | ORAL | 11 refills | Status: AC
  Filled 2024-03-14: qty 30, 30d supply, fill #0

## 2024-01-07 MED ORDER — HYDROCHLOROTHIAZIDE 25 MG PO TABS
25.0000 mg | ORAL_TABLET | Freq: Every morning | ORAL | 11 refills | Status: AC
  Filled 2024-03-14: qty 30, 30d supply, fill #0

## 2024-01-08 ENCOUNTER — Other Ambulatory Visit (HOSPITAL_BASED_OUTPATIENT_CLINIC_OR_DEPARTMENT_OTHER): Payer: Self-pay | Admitting: Family Medicine

## 2024-01-08 DIAGNOSIS — Z136 Encounter for screening for cardiovascular disorders: Secondary | ICD-10-CM

## 2024-01-12 ENCOUNTER — Other Ambulatory Visit: Payer: Self-pay

## 2024-01-19 ENCOUNTER — Other Ambulatory Visit: Payer: Self-pay

## 2024-01-22 ENCOUNTER — Other Ambulatory Visit: Payer: Self-pay

## 2024-01-24 ENCOUNTER — Other Ambulatory Visit: Payer: Self-pay

## 2024-01-25 ENCOUNTER — Other Ambulatory Visit: Payer: Self-pay

## 2024-01-25 MED ORDER — ONDANSETRON HCL 4 MG PO TABS
4.0000 mg | ORAL_TABLET | Freq: Four times a day (QID) | ORAL | 0 refills | Status: AC
  Filled 2024-01-25: qty 120, 30d supply, fill #0

## 2024-01-25 MED ORDER — VITAMIN D (ERGOCALCIFEROL) 1.25 MG (50000 UNIT) PO CAPS
50000.0000 [IU] | ORAL_CAPSULE | ORAL | 2 refills | Status: AC
  Filled 2024-01-25: qty 4, 28d supply, fill #0
  Filled 2024-02-17: qty 4, 28d supply, fill #1
  Filled 2024-03-16: qty 4, 28d supply, fill #2

## 2024-01-29 ENCOUNTER — Other Ambulatory Visit: Payer: Self-pay

## 2024-02-03 ENCOUNTER — Other Ambulatory Visit: Payer: Self-pay

## 2024-02-09 ENCOUNTER — Other Ambulatory Visit: Payer: Self-pay

## 2024-02-09 MED ORDER — WEGOVY 2.4 MG/0.75ML ~~LOC~~ SOAJ
2.4000 mg | SUBCUTANEOUS | 1 refills | Status: DC
  Filled 2024-02-14: qty 3, 28d supply, fill #0
  Filled 2024-03-09: qty 3, 28d supply, fill #1

## 2024-02-15 ENCOUNTER — Other Ambulatory Visit: Payer: Self-pay

## 2024-02-26 ENCOUNTER — Other Ambulatory Visit: Payer: Self-pay

## 2024-03-02 ENCOUNTER — Other Ambulatory Visit: Payer: Self-pay

## 2024-03-08 ENCOUNTER — Other Ambulatory Visit: Payer: Self-pay

## 2024-03-09 ENCOUNTER — Other Ambulatory Visit: Payer: Self-pay

## 2024-03-10 ENCOUNTER — Other Ambulatory Visit: Payer: Self-pay

## 2024-03-11 ENCOUNTER — Other Ambulatory Visit: Payer: Self-pay

## 2024-03-14 ENCOUNTER — Other Ambulatory Visit: Payer: Self-pay

## 2024-03-15 ENCOUNTER — Other Ambulatory Visit: Payer: Self-pay

## 2024-03-17 ENCOUNTER — Other Ambulatory Visit: Payer: Self-pay

## 2024-03-30 ENCOUNTER — Other Ambulatory Visit: Payer: Self-pay

## 2024-04-05 ENCOUNTER — Other Ambulatory Visit: Payer: Self-pay

## 2024-04-07 ENCOUNTER — Other Ambulatory Visit: Payer: Self-pay

## 2024-04-07 MED ORDER — WEGOVY 2.4 MG/0.75ML ~~LOC~~ SOAJ
SUBCUTANEOUS | 1 refills | Status: AC
  Filled 2024-04-07: qty 3, 28d supply, fill #0
